# Patient Record
Sex: Male | Born: 1950 | Hispanic: No | Marital: Married | Smoking: Never smoker
Health system: Southern US, Community
[De-identification: ages and names within clinical notes are randomized; demographics above are authoritative.]

## PROBLEM LIST (undated history)

## (undated) DIAGNOSIS — N400 Enlarged prostate without lower urinary tract symptoms: Secondary | ICD-10-CM

## (undated) DIAGNOSIS — K4020 Bilateral inguinal hernia, without obstruction or gangrene, not specified as recurrent: Secondary | ICD-10-CM

## (undated) DIAGNOSIS — R972 Elevated prostate specific antigen [PSA]: Secondary | ICD-10-CM

## (undated) HISTORY — PX: APPENDECTOMY: SHX54

## (undated) HISTORY — PX: PROSTATE BIOPSY: SHX241

## (undated) HISTORY — DX: Bilateral inguinal hernia, without obstruction or gangrene, not specified as recurrent: K40.20

## (undated) HISTORY — DX: Benign prostatic hyperplasia without lower urinary tract symptoms: N40.0

## (undated) HISTORY — DX: Elevated prostate specific antigen (PSA): R97.20

---

## 2007-07-13 DIAGNOSIS — K4090 Unilateral inguinal hernia, without obstruction or gangrene, not specified as recurrent: Secondary | ICD-10-CM | POA: Insufficient documentation

## 2009-05-16 ENCOUNTER — Encounter: Payer: Self-pay | Admitting: Gastroenterology

## 2010-03-03 NOTE — Miscellaneous (Unsigned)
Continuity of Care Record  Created: todo  From: Mellody Memos  From:   From: [employer], EHR [id number]  To: Breshears, Burle  Purpose: Patient Use;       Problems  Diagnosis: Inguinal Hernia; Left (550.90)     Alerts  Allergy - No Known Drug Allergy     Medications  Non-medication order(s); Truss-bilateral.Dx-Inguiunal hernia. ; Rx     Immunizations  Td   MMR   PPD

## 2012-03-20 ENCOUNTER — Telehealth: Payer: Self-pay | Admitting: Primary Care

## 2012-03-20 ENCOUNTER — Encounter: Payer: Self-pay | Admitting: Primary Care

## 2012-03-20 NOTE — Telephone Encounter (Signed)
Left a message on 01-05-12 and 03-06-12 to call the office

## 2012-03-20 NOTE — Telephone Encounter (Signed)
No response to calls, letter sent.

## 2012-04-03 NOTE — Telephone Encounter (Signed)
I have placed two calls & sent a letter to the patient with no response regarding an overdue colonoscopy.  Please let your staff know if you wish to continue to follow up on this issue.

## 2012-04-03 NOTE — Telephone Encounter (Signed)
noted 

## 2012-12-14 ENCOUNTER — Telehealth: Payer: Self-pay | Admitting: Primary Care

## 2012-12-14 NOTE — Telephone Encounter (Signed)
Phone call to patient to remind him he is overdue for his colonoscopy.  I spoke with Gary Mcgrath's wife who informed me that her husband has a new PCP; Dr. Lennie Muckle.  Information changed in [employer].

## 2013-05-21 LAB — COLONOSCOPY

## 2014-02-22 HISTORY — PX: COLONOSCOPY: SHX174

## 2014-02-26 ENCOUNTER — Other Ambulatory Visit: Payer: Self-pay | Admitting: Urology

## 2014-11-07 ENCOUNTER — Encounter: Payer: Self-pay | Admitting: Gastroenterology

## 2014-11-19 ENCOUNTER — Ambulatory Visit: Payer: Self-pay | Admitting: Surgical Oncology

## 2014-11-19 ENCOUNTER — Encounter: Payer: Self-pay | Admitting: Surgical Oncology

## 2014-11-19 VITALS — BP 149/90 | HR 73 | Temp 97.9°F | Resp 16 | Ht 66.0 in | Wt 135.0 lb

## 2014-11-19 DIAGNOSIS — K4090 Unilateral inguinal hernia, without obstruction or gangrene, not specified as recurrent: Secondary | ICD-10-CM

## 2014-11-19 DIAGNOSIS — C61 Malignant neoplasm of prostate: Secondary | ICD-10-CM

## 2014-11-19 NOTE — Patient Instructions (Signed)
You have bilateral inguinal hernias.    We have recommended a laparoscopic bilateral hernia repair.    We will see you back after your prostate biopsy.    PATIENT INFORMATION  Lanier Ensign Emmet Messer MD. PhD. [place] AND LAPAROSCOPIC REPAIR:    Approximately 600,000 hernia repair operations are performed annually in the [place].  Many are performed by the conventional "open" method.  Some hernia repairs are performed using a small telescope known as a laparoscope.  If your surgeon has recommended a laparoscopic repair, this brochure can help you understand what a hernia is and about the treatment.     WHAT IS A HERNIA?    A hernia occurs when the inside layer of the abdominal muscle have weakened, resulting in a bulge or tear.  In the same way that an inner tube pushes through a damaged tire, thee inner lining of the abdomen pushes through the weakened area of the abdominal wall to form a small balloon like sac.  This can allow a loop of intestine or abdominal tissue to push into the sac.  The hernia can cause severe pain and other potentially serious problems that could require emergency surgery     Both men and women can get a hernia   You may be born with a hernia (congenital) or develop one over time.    A hernia does not get better over time, nor will it go away by itself.       HOW DO I KNOW IF I HAVE A HERNIA ?    The common areas where hernias occur are in the groin (inguinal), belly button  (umbilical), and the site of a previous operation (incisional).     It is usually easy to recognize a hernia.  You may notice a bulge under the skin.   You may feel pain when you lift heavy objects, cough, strain during urination or bowel movements, or during prolonged standing or sitting.      The pain may be sharp and immediate or a dull ache that gets worse toward the end of the day.     Severe, continuous pain, redness, and tenderness are signs that the  hernia may be entrapped or strangulated.  These symptoms are cause for concern and immediate contact of your physician or surgeon is required.         WHAT CAUSES A HERNIA?    The wall of the abdomen has natural areas of potential weakness.  Hernias can develop at these or other areas due to heavy strain on the abdominal wall, aging, injury, an old incision or a weakness present from birth. Anyone can develop a hernia at any age.  Most hernias in children are congenital.  In adults, a natural weakness or strain from heavy lifting, persistent coughing, difficulty with bowel movements or urinating can cause the abdominal wall to weaken or separate.     WHAT ARE THE ADVANTAGES OF LAPAROSCOPIC HERNIA REPAIR?    Laparoscopic Hernia Repair is a technique to fix tears in the abdominal wall (muscle) using small incisions, telescopes and a patch (mesh). It may offer a quicker return to work and normal activities with decreased pain for some patients.     ARE YOU A CANDIDATE FOR LAPAROSCOPIC HERNIA REPAIR?    Only after a thorough examination can your surgeon determine whether laparoscopic hernia repair is right for you.  The procedure may  not be best for some patients who have had previous abdominal surgery or underlying medical conditions.      WHAT PREPARATION IS REQUIRED?    Most hernia operations are performed on an outpatient basis, and therefore you will probably go home on the same day that the operation is performed.     Preoperative preparation includes blood work, medical evaluation, chest x-ray and an EKG depending on your age and medical condition.     After your surgeon reviews with you the potential risks and benefits of the operation, you will need to provide written consent for surgery.     It is recommended that you shower the night before or morning of the operation.    If you have difficulties moving your bowels, an enema or similar preparation may be used after consulting with your surgeon.     After  midnight the night before the operation, you should not eat or drink anything except medications that your surgeon has told you are permissible to take with a sip of water the morning of surgery.     Blood thinners such as Warfarin will need to be held 5 days prior to surgery.  Medications such as Aspirin, Ibuprofen and Naproxen should be avoided 7 days prior to surgery.     Diet medications or St. John's Wort should not be used for the two weeks prior to surgery.      Quit smoking and arrange for any help you may need at home.      HOW IS THE PROCEDURE PERFORMED?    There are few options available for a patient who has a hernia.  Most hernias require a surgical procedure.     Surgical procedures are done in one of two fashions:    1.  The open approach is done from the outside through a three to four inch incision in the groin or the area of the hernia.  The incision will extend through the skin, subcutaneous fat, and allow the surgeon to get to the level of the defect.  The surgeon may choose to use a small piece of surgical mesh to repair the defect or hole.  This technique is usually done with a local anesthetic and sedation but may be performed using a spinal or general anesthetic.       2.  The laparoscopic hernia repair.  In this approach, a laparoscope (a tiny telescope) connected to a special camera is inserted through a cannula, a small hollow tube, allowing the surgeon to view the hernia and surrounding tissue on a video screen.     Other cannulas are inserted which allow your surgeon to work "inside."  A single one inch incision is made in the umbilicus are usually necessary.  The hernia is repaired from behind the abdominal wall.  A small piece of surgical mesh is placed over the hernia defect and held in place with small surgical staples or tacks.  This operation is usually performed with general anesthesia.         WHAT HAPPENS IF THE OPERATION CANNOT BE PERFORMED OR COMPLETED BY THE LAPAROSCOPIC  METHOD?    In a small number of patients the laparoscopic method cannot be performed.  Factors that may increase the possibility of choosing or converting to the "open" procedure may include obesity, a history of prior abdominal surgery cause dense scar tissue, inability to visual organs or bleeding problems during the surgery.     The decision to perform the open  procedure is a judgement decision made by your surgeon either before or during the actual operation.  When the surgeon feels that is is safest to convert the laparoscopic procedure to an open one, this is not a complication, but rather sound surgical judgement.  The decision to convert to an open procedure is strictly based on patient safety.    WHAT SHOULD I EXPECT AFTER SURGERY?    Following the operation, you will be transferred to the recovery room where you will be monitored for 1 - 2 hours until you are fully awake.      Once you are awake, taking fluid orally without nausea, passed urine and pain is controlled, you will be sent home.     With any hernia operation, you can expect some soreness mostly during the first 24 -48 hours.     You are encouraged to be up and about the day after surgery.      With laparoscopic hernia repair, you will probably be able to get back to your normal activities within a short amount of the time. These activities include showering, driving, walking upstairs, lifting, working and engaging in sexual intercourse.     Call and schedule a follow up appointment within 2 weeks after your operation.      WHAT COMPLICATIONS CAN OCCUR ?    Any operation may be associated with complications.  The primary complications of any operation are bleeding and infection, which are uncommon with laparoscopic hernia repair.     There is a slight risk of injury to the urinary bladder, the intestines, blood vessels, nerves or the sperm tube going to the testicle.      Difficulty urinating after surgery is not unusual and may require a  temporary tube into the urinary bladder for as long as one week.     Any time a hernia is repaired it can come back.  This long-term recurrence rate less than 1 %.   Your surgeon will help you decide if the risks of laparoscopic hernia repair are less than the risks of leaving the condition untreated.     WHEN TO CALL YOUR DOCTOR    Be sure to call your physician or surgeon if you develop any of the following:     Persistent fever over 101.5 degrees F (39 C)   Bleeding   Increasing abdominal or groin swelling   Pain that is not relieved by your medications   Persistent nausea or vomiting   Inability to urinate   Chills   Persistent cough or shortness of breath   Purulent drainage (pus) from any incision   Redness surrounding any of your incisions that is worsening or getting bigger   You are unable to eat or drink liquids

## 2014-11-19 NOTE — H&P (Signed)
History and Physical  Chief Complaint   Patient presents with    New Patient Visit     History of Present Illness: Gary Mcgrath is a spirited 64 year old male who comes to the office for evaluation of an inguinal hernia. He and his wife report that he was first told he had a hernia on his left side ten years ago by his primary care physician at that time. As it was not bothersome, he did not seek any further treatment. Since then, he has noticed an increase in size and frequency of a bulge protruding from his left groin. 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 He denies any pain associated with the bulging and has not had changes in his stooling habits. He does report recently being worked up for prostate cancer via prostate MRI and biopsy, showing some evidence of low-grade prostate cancer in 03/2015 as reported by the patient and his wife. He will return a confirmatory biopsy in January of 2017. He denies any history of COPD or chronic cough. He does offer that his father and his son have both had hernias.      Patient Active Problem List   Diagnosis Code    Inguinal Hernia K40.90     No past medical history on file.  No past surgical history on file.   Allergies   Allergen Reactions    Epinephrine Other (See Comments)     Fainting     No Known Drug Allergy      Created by Conversion - 0;       reports that he has never smoked. He does not have any smokeless tobacco history on file.  No outpatient encounter prescriptions on file as of 11/19/2014.     No facility-administered encounter medications on file as of 11/19/2014.      No family history on file.  He reports that he has never smoked. He does not have any smokeless tobacco history on file.  Review of Systems   Constitutional: Negative for fever and weight loss.   HENT: Negative for sore throat.    Eyes: Negative for photophobia.   Respiratory:  Negative for cough and shortness of breath.    Cardiovascular: Negative for chest pain, palpitations and leg swelling.   Gastrointestinal: Positive for abdominal pain. Negative for constipation, diarrhea, heartburn, nausea and vomiting.   Genitourinary: Negative for dysuria.   Musculoskeletal: Negative for back pain.   Skin: Negative for itching and rash.   Neurological: Negative for headaches.   Endo/Heme/Allergies: Does not bruise/bleed easily.     Visit Vitals    BP 149/90 (BP Location: Left arm, Patient Position: Sitting, Cuff Size: adult)    Pulse 73    Temp 36.6 C (97.9 F) (Temporal)    Resp 16    Ht 167.6 cm (5\' 6" )    Wt 61.2 kg (135 lb)    SpO2 98%    BMI 21.79 kg/m2     Physical Exam   Constitutional: He is oriented to person, place, and time. He appears well-developed and well-nourished. No distress.   HENT:   Head: Normocephalic and atraumatic.   Mouth/Throat: No oropharyngeal exudate.   Eyes: Conjunctivae and EOM are normal. Pupils are equal, round, and reactive to light. No scleral icterus.   Neck: Normal range of motion. Neck supple.   Cardiovascular: Normal rate, normal heart  sounds and intact distal pulses.    No murmur heard.  Pulmonary/Chest: Effort normal and breath sounds normal. No respiratory distress. He has no wheezes.   Abdominal: Soft. Bowel sounds are normal. He exhibits no distension and no mass. There is no tenderness. There is no rebound and no guarding. A hernia is present. Hernia confirmed positive in the right inguinal area and confirmed positive in the left inguinal area.   Genitourinary: Penis normal.       Right testis shows no mass. Left testis shows no mass.   Musculoskeletal: Normal range of motion. He exhibits no edema or tenderness.   Lymphadenopathy:     He has no cervical adenopathy.        Right: No inguinal adenopathy present.        Left: No inguinal adenopathy present.   Neurological: He is alert and oriented to person, place, and time. He exhibits normal  muscle tone.   Skin: Skin is warm and dry. No rash noted. No erythema. No pallor.   Psychiatric: He has a normal mood and affect. His behavior is normal. Judgment and thought content normal.     Imaging:   MRI of prostate: extra-prostatic imaging noted bilateral, fat-containing inguinal hernias      Assessment:   This is a vigorous 64 year old man who reports a one year history of low risk prostate cancer undergoing watchful waiting who presents with prior imaging and physical exam consistent with bilateral direct inguinal hernias. He is currently completing workup for his prostate, with plans to undergo a further biopsy and imaging this coming January and would like to plan further management of these hernias following further characterization of his disease.    We generally recommend laparoscopic properitoneal repair for inguinal hernias.  Per the patient's's report, his prostate cancer was felt to be very indolent and unlikely to need therapy in the near future.  While it has been proven that robotic prostatectomy can be performed in patients who have previously undergone extraperitoneal laparoscopic inguinal hernia repair with mesh, it does complicate the procedure and therefore multidisciplinary treatment planning is prudent.    It seems that the patient was here for an informational visit and that while his hernia has enlarged over the last several years it remains minimally asymptomatic and does not interfere with his work or play.    Plan:    The patient will follow-up in January   He was instructed to call the office should symptoms arise in the interim   We anticipate performing a laparoscopic bilateral inguinal hernia repair on an ambulatory basis   Instructional information was given    Dictated with Dragon and every reasonable attempt has been made to ensure accuracy.

## 2015-02-23 HISTORY — PX: HERNIA REPAIR: SHX51

## 2015-03-18 ENCOUNTER — Encounter: Payer: Self-pay | Admitting: Surgical Oncology

## 2015-03-18 ENCOUNTER — Ambulatory Visit: Payer: Self-pay | Admitting: Surgical Oncology

## 2015-03-18 DIAGNOSIS — K4090 Unilateral inguinal hernia, without obstruction or gangrene, not specified as recurrent: Secondary | ICD-10-CM

## 2015-03-18 NOTE — Progress Notes (Signed)
Chief Compliant    Gary Mcgrath is a 65 y.o. male who presents for follow up care on 03/18/2015 for follow-up.     Diagnosis   Bilateral inguinal hernia, prior history of low volume prostate cancer   History of the Present Illness   The patient presents today wishing to proceed with hernia repair.  He was seen earlier regarding a symptomatic left angle hernia and small occult right inguinal hernia.  He had a prostate biopsy that suggests the possibility of early stage prostate cancer.  Repeat biopsies performed in November 2016 and per the patient was negative.  Watchful waiting is being planned for his prostate.    He remains symptomatic from his left inguinal hernia.  There is localized pain.  It has not changed in size.  He has no gastrointestinal symptoms.  He has adequate urine flow.    He is planning follow-up with his urologist Dr. Legrand Como in 12 months.  The patient is also planning retirement in April 2017.  He wishes to proceed with hernia repair.    Past Medical History   Diagnosis Date    Bilateral inguinal hernia     Enlarged prostate      Past Surgical History   Procedure Laterality Date    Appendectomy       age 48    Colonoscopy  2016    Prostate biopsy  03/2014 & 12/2014     Interval History    See above  Social History     Social History     Social History    Marital status: Married     Spouse name: N/A    Number of children: N/A    Years of education: N/A     Occupational History    Not on file.     Social History Main Topics    Smoking status: Never Smoker    Smokeless tobacco: Never Used    Alcohol use Not on file      Comment: social drinker    Drug use: No    Sexual activity: Not on file     Social History Narrative    He is employed as a Personal assistant.  He is married and lives with his wife.  They have 1 son. His is active,        Medications     Prior to Admission medications    Not on File      Allergies     Allergies   Allergen Reactions    Epinephrine Other (See Comments)        Fainting      Review of Systems   Review of Systems   Constitutional: Negative for fever and weight loss.   HENT: Negative for sore throat.    Eyes: Negative for photophobia.   Respiratory: Negative for cough and shortness of breath.    Cardiovascular: Negative for chest pain, palpitations and leg swelling.   Gastrointestinal: Positive for abdominal pain. Negative for constipation, diarrhea, heartburn, nausea and vomiting.   Genitourinary: Negative for dysuria.   Musculoskeletal: Negative for back pain.   Skin: Negative for itching and rash.   Neurological: Negative for headaches.   Endo/Heme/Allergies: Does not bruise/bleed easily.     Physical Exam     Visit Vitals    BP 137/81 (BP Location: Left arm, Patient Position: Sitting, Cuff Size: adult)    Pulse 97    Temp 36.8 C (98.2 F) (Temporal)    Resp 16  Ht 167.6 cm (5\' 6" )    Wt 62.1 kg (137 lb)    SpO2 97%    BMI 22.11 kg/m2     Physical Exam   Constitutional: He is oriented to person, place, and time. He appears well-developed and well-nourished. No distress.   HENT:   Head: Normocephalic and atraumatic.   Mouth/Throat: No oropharyngeal exudate.   Eyes: Conjunctivae and EOM are normal. Pupils are equal, round, and reactive to light. No scleral icterus.   Neck: Normal range of motion. Neck supple.   Cardiovascular: Normal rate, normal heart sounds and intact distal pulses.    No murmur heard.  Pulmonary/Chest: Effort normal and breath sounds normal. No respiratory distress. He has no wheezes.   Abdominal: Soft. Bowel sounds are normal. He exhibits no distension and no mass. There is no tenderness. There is no rebound and no guarding. A hernia is present. Hernia confirmed positive in the right inguinal area and confirmed positive in the left inguinal area.       Musculoskeletal: Normal range of motion. He exhibits no edema or tenderness.   Lymphadenopathy:     He has no cervical adenopathy.   Neurological: He is alert and oriented to person, place,  and time. He exhibits normal muscle tone.   Skin: Skin is warm and dry. No rash noted. No erythema. No pallor.   Psychiatric: He has a normal mood and affect. His behavior is normal. Judgment and thought content normal.     Laboratory Data     Imaging Data     Assessment    Vigorous 65 year old with low risk prostate history.  He is a good candidate for laparoscopic inguinal hernia repair.  Should he require surgical intervention on his prostate in the future, it has been established that it can be safely done using a robotic approach despite prior laparoscopic inguinal hernia repair.    It is important to the patient to have the hernia repair scheduled after 08 May 2015.  Plan    Laparoscopic bilateral inguinal hernia repair on an ambulatory basis at [facility] after March 16.  There will be a preoperative visit.    Author: Charolotte Eke, MD  as of: 03/18/2015  at: 11:45 AM

## 2015-05-07 ENCOUNTER — Ambulatory Visit: Payer: Self-pay | Admitting: Surgery

## 2015-05-13 NOTE — Invasive Procedure Plan of Care (Cosign Needed)
Invasive Procedure Plan of Care (Consent Form 419):   Condition(s) Addressed: Bilateral inguinal hernia   Person Performing Procedure: SCHOENIGER, LUKE   Side: Bilateral    Procedure: Laparoscopic bilateral inguinal hernia repair   Special Equipment: none   Planned Anesthesia: General   Benefits: Relief of symptoms   Risks: Bleeding, infection, blood clots, risks of anesthesia: heart/lung problems, injury to adjacent structures, hematoma/seroma, recurrence, urinary retention   Alternatives: Traditional approach, supportive care (belt, truss)   Expected Length of Stay:  day(s)   Pt Decision: Agrees to proceed     ----------------------------------------------------------------------------------------------------------------------------------------  Consent:  I hereby give my consent and authorize SCHOENIGER, LUKE  (The list of possible assistants, all of whom are privileged to provide surgical services at the hospital, is available)  To treat the following: Bilateral inguinal hernia  Procedure includes: Laparoscopic bilateral inguinal hernia repair  1 The care provider has explained my condition to me, the benefits of having the above treatment procedure, and alternate ways of treating my condition. I understand that no guarantees have been made to me about the result of the treatment. The alternatives to this procedure include: Traditional approach, supportive care (belt, truss)   2 The care provider has discussed with me the reasonably foreseeable risks of the treatment and that there may be undesirable results. The risks that are specifically related to this procedure include: Bleeding, infection, blood clots, risks of anesthesia: heart/lung problems, injury to adjacent structures, hematoma/seroma, recurrence, urinary retention   3 I understand that during the treatment a condition may be discovered which was not known before the treatment started. Therefore, I authorize the care provider to perform any additional  or different treatment which is thought necessary and available.   4 Any tissue, parts, or substances removed during the procedure may be retained or disposed of in accordance with customary scientific, educational and clinical practice.   5 Vendor information if appropriate: If a vendor representative is expected to be present during my procedure, it has been explained to me that the vendor representative works for (manufacturer of the device to be used) and that his/her role includes . I consent to the vendor representative's presence and involvement as described. If circumstances change and a decision is made during my procedure that a vendor representative's presence is needed, I will be notified of the above after my procedure is completed.  Equipment: none     6 Patient Consent for Medical or Surgical Procedure: I have carefully read and fully understand this informed consent form, and have had sufficient opportunity to discuss my condition and the above procedure(s) with the care provider and his/her associates, and all of my questions have been answered to my satisfaction.    Agrees to proceed       ____________________________________________________   _______________   Signature of Patient  Date/Time     ____________________________________________________   _______________   Signature of Parent or Legal Guardian  (if Patent is unable to sign or is a minor) Date/Time       Complete this section for all OR procedures and all other invasive internal procedures performed in any setting.  7 Consent for Receipt of Tissue(s): Not applicable   8 For those procedures that have the potential for significant blood loss: transfusion is not expected to be needed but may be required and given in an emergency, I consent to the transfusion of blood or blood components that may be necessary before, during or after the procedure. I have been  informed that no transfusion is 100% safe, however present testing methods make  the risks of infection very small. Risks include infection from viruses, bacteria, or parasites, including but not limited to HIV (the AIDS virus) and hepatitis, as well as fever, chills, allergy, volume overload, or death. I have discussed possible alternatives with my care provider, including no transfusion, autologous transfusion (donation of my own blood), designated/directed donor transfusion (collection of blood from donors selected by me) or blood salvage during the procedure. I understand that these alternatives may not be available due to timing or health reasons, and the above risks may still apply.   9 Patient Consent for Blood/Tissue: I have had a chance to discuss the risks, benefits and alternatives regarding transfusion/receipt of tissue (as above) with my healthcare provider. My decision(s) regarding the transfusion of blood or blood components and/or the receipt of tissue are as above. I understand this covers my perioperative/periprocedural (before, during, and after the surgery/procedure) course of treatment.    Patient sign here unless 7 and 8 do not apply.       ____________________________________________________   _______________   Signature of Patient Date/Time     ____________________________________________________   _______________   Signature of Parent or Legal Guardian  (if Patent is unable to sign or is a minor) Date/Time      I have discussed the planned procedure, including the potential for any transfusion of blood products or receipt of tissue as necessary, expected benefits, the potential complications and risks and possible alternatives and their benefits and risks with the patient or the patient's surrogate. In my opinion, the patent or the patient's surrogate understands the proposed procedure, its risks, benefits, and alternatives.    Electronically signed by Arty Baumgartner, NP at 11:29 AM

## 2015-05-14 ENCOUNTER — Encounter: Payer: Self-pay | Admitting: Surgery

## 2015-05-14 ENCOUNTER — Ambulatory Visit: Payer: Self-pay | Admitting: Surgery

## 2015-05-14 VITALS — BP 128/67 | HR 86 | Temp 97.2°F | Resp 16 | Ht 66.0 in | Wt 135.0 lb

## 2015-05-14 DIAGNOSIS — K4020 Bilateral inguinal hernia, without obstruction or gangrene, not specified as recurrent: Secondary | ICD-10-CM

## 2015-05-14 NOTE — H&P (Signed)
Dear Dr. Moreen Fowler,    We had the pleasure of seeing Gary Mcgrath in surgery clinic for continuing follow up and definitive surgical planning.     Lakhi Dils is a 65 y.o. male with a past medical history significant for BPH, elevated PSA (prostate biopsy neg for malignancy: 03/2015) and bilateral inguinal hernia.     The patient was initially seen in September 2016 with complaint of enlargement and pain of a known left inguinal hernia was diagnosed by his PCP 10 years prior. Physical exam findings confirmed left inguinal hernia and a small right inguinal hernia.  Laparoscopic repair was recommended.  At that time he was undergoing evaluation for possible prostate cancer. He demonstrated minimal symptoms and it was deemed safe for him to postpone surgery until prostate cancer evaluation completed.     He was last seen in our office in January 2017 with request to proceed with surgery. Recent prostate biopsy was negative     He presents today with his wife. They have concerns in regards to his upcoming surgery.  From a clinical standpoint he reports noticing some enlargement of the bulge and increasing discomfort with prolonged standing. He denies GI obstructive symptoms.  He does have BPH with nocturia x 2.  He denies chronic cough or constipation    Review of Systems:  A comprehensive review of systems was negative except as above    Sayd  has a past medical history of Bilateral inguinal hernia and Enlarged prostate.    He  has a past surgical history that includes Appendectomy; Colonoscopy (2016); and ostate biopsy (03/2014 & 12/2014, 03/2015).    He currently has no medications in their medication list.    He is allergic to epinephrine.    He  reports that he has never smoked. He has never used smokeless tobacco.    Xue  reports that he drinks alcohol.     Family history includes Alzheimer's disease in his father; Breast cancer in his mother; Hypertension in his brother and father.    Speaks  [place].  Understands/speaks English. He is employed as a Personal assistant.  He is married and lives with his wife.  They have 1 son.     Physical Exam:  Abdulahi's  height is 167.6 cm (5\' 6" ) and weight is 61.2 kg (135 lb). His temporal temperature is 36.2 C (97.2 F). His blood pressure is 128/67 and his pulse is 86. His respiration is 16 and oxygen saturation is 98%.      General Appearance:    Alert, cooperative, no distress, appears stated age   Head:    Normocephalic, without obvious abnormality, atraumatic   Eyes:    PERRL, conjunctiva/corneas clear, EOM's intact, benign, both eyes        Throat:   Lips, mucosa, and tongue normal; teeth and gums normal   Neck:   Supple, symmetrical, trachea midline, no adenopathy;        thyroid:  No enlargement/tenderness/nodules; no carotid    bruit or JVD   Lungs:     Clear to auscultation bilaterally, respirations unlabored   Heart:    Regular rate and rhythm, S1 and S2 normal, no murmur, rub   or gallop   Abdomen:     Soft, non-tender, bowel sounds active all four quadrants,     no masses, no organomegaly. Healed RLQ incision.  Obvious left inguinal hernia. Small right inguinal hernia on Valsalva.       Extremities:   Extremities normal, atraumatic, no  cyanosis or edema      Assessment:      65 year old male with bilateral inguinal hernia (left > right).  Surgical repair is planned for 05/19/2015.  The patient and wife have additional questions regarding risks of surgery and possible alternatives. Dr. Dennard Schaumann to obtain consent day of surgery      Plan:      1. Discussed the risk of surgery including bleeding, infection, blood clots, injury to adjacent structures, recurrence, seroma/hematoma, urinary retention, and the risks of general anesthetic including MI, CVA, sudden death or even reaction to anesthetic medications. The patient understands the risks, any and all questions were answered to the patient's satisfaction.  2.Preoperative teaching completed, patient verbalizes  understanding of instructions, he does not object to blood transfusion, preoperative orders completed. Patient to discontinue ASA/NSAIDS 7 days prior to surgery.     Although patient speaks/understands English there was a mild language barrier, his wife served as interpreter during the history-taking and subsequent discussion with this patient.    He is anxious to proceed and will be seen again in follow up two weeks after surgery.    Best wishes,  Arty Baumgartner, NP  05/14/2015

## 2015-05-14 NOTE — Patient Instructions (Addendum)
[facility]     NAME: Gary Mcgrath    SURGERY DATE: Monday, March 27     1. On the day before surgery CALL [phone] between 2:30 PM and 7 PM to find out: a): the time to arrive at the [facility] and b) the time of your procedure.    NOTE: Patients scheduled for a procedure on Monday should call the Friday before.    Please note surgery start time is approximate. You may want to bring something to help pass the time. PLEASE ARRIVE ON TIME.         2. DIRECTIONS TO [facility]: On the day of your procedure  park in the parking garage and take the elevator/stairs to Level One (1), then follow the walkway to the Main Lobby.  Walk past the Information Desk in the lobby, towards the Lab & Outpatient Services.  Follow the RED (R) ceiling tags to the RED elevators. (Valet parking is available outside the front entrance of the hospital between 6:00 AM and 5:00 PM.)    [place] (B-Level): take the RED elevators to the Basement (Level B - Two floors down) to the [place] and check in with the receptionist at the desk.       3. Before coming to the hospital, remove all makeup, (including mascara), jewelry (including wedding band and watch), hair accessories and nail polish from toes and fingers. Do not bring any valuables (money, wallet, purse, jewelry, or contact lenses).   Bring Photo ID        4. Call your surgeon if you become ill before the procedure day, i.e. fever, chills, nausea, vomiting, sore throat.       5. EATING GUIDELINES: Follow the instructions below unless directed by your surgeon. Starting:        Nothing to eat after midnight on the day of your surgery. No candy,gum, mints. You can have clear liquids up to 4 hours before your surgery. This includes water, apple juice, clear carbonated beverages, black coffee, clear tea. No milk, cream, or non dairy creamers.   Failure to follow these instructions, could lead to a  delay or cancellation of your procedure.       6. MEDICATIONS:    On the morning of surgery, take only the medications listed below at the usual time or before leaving for the  hospital:     No medications    Medications should only be taken with no more than one ounce of water.  You may take Tylenol (Acetaminophen) if needed.    Aspirin: Do not take any aspirin products for 7 days before the procedure date.  Anti-Inflammatory products: Do not take any non-steroidal anti-inflammatory agents such as Ibuprofen (Advil, Motrin) or Naproxen (Aleve) 7 days before the procedure.       7.  EXPECTED STAY:    ([place]) Same Day Surgery: You are going home on the same day as your surgery. Most people are ready for discharge one half hour to two hours after returning to the [facility] from the [facility] (PACU). You will be given discharge instructions before you go home.    Health standards require that a responsible adult must accompany any patient who has received anesthetics or sedation and is going home the same day.   ? You must arrange a ride home before coming to surgery.         8. Your family will be  directed to a waiting area when you are taken to surgery.  ? We ask that only one or two family members accompany you on the day of your procedure.         ? No children under the age of 81 are allowed as visitors in [place]  ? Your family will be notified when your surgery is completed and you have arrived on the patient care unit.        Proper Handwashing is simple - here's all you have to do:    When using soap & water:  The following steps only take 15 to 20 seconds. To time yourself sing the Happy Birthday song twice.   1. Wet your hands and forearms. Put some soap in your palm and rub your palms together.    2. Next, rub your right palm over the left hand, then your left palm over the right hand.    3. Rub your hands together palm to palm, with your fingers interlaced.    4. Rub the  backs of your fingers up against the opposing fingers. Make sure to clean under and around your fingernails.    5. Hold your right thumb with your left hand and rotate. Do the same with the opposite hand.    6. Rub your right palm with fingertips of your left hand, then your left palm with the fingertips of your right hand.    When using hand gel:   1. Use enough hand gel or foam to completely cover your hands.      2. Rub your hands together, palm to palm.    3. Rub the back of each hand with the palm of the other hand.      4. Spread the sanitizer  over and under  your fingernails.        5. Spread the gel  between your fingers.    6. keep rubbing your  hands together  until they are dry.      7. Do not rinse your hands or dry them with a towel.        Common Germs:   Influenza (Flu):  Easily passed from person to person through coughing or sneezing (if the nose and mouth are not covered).   Staph Aureus/MRSA:  Can cause a wide range of Illnesses from minor skin infections to life threatening conditions such as pneumonia, blood stream infections and toxic shock syndrome. Most often passed on by direct contact with someone who has the germ.   Salmonella:  Found in feces of infected person or animal. Passed  through uncooked food, un-washed vegetables and contaminated utensils.   E. Coli:  Found on animals, animal parts and obejects that have been in contact with animals. Often passed through contaminated food.       Surgical Site Infections FAQs    What is a Surgical Site infection (SSI)?  A surgical site infection is an infection that occurs after surgery In the part of the body where the surgery took place. Most patients who have surgery do not develop an infection. However, Infections develop in about 1 to 3 out of every 100 patients who have surgery.     Some of the common symptoms of a surgical site infection are:    Redness and pain around the area where you had surgery    Drainage of cloudy fluid from your  surgical wound    Fever     Can SSIs  be treated?   Yes. Most surgical site infections can be treated with antibiotics. The antibiotic given to you depends on the bacteria (germs) causing the Infection. Sometimes patients with SSIs also need another surgery to treat the infection.     What are some of the things that hospitals are doing to prevent SSls?   To prevent SSIs, doctors, nurses, and other healthcare providers:    Clean their hands and arms up to their elbows with an antiseptic agent just before the surgery.    Clean their hands with soap and water or an alcohol-based hand rub before and after caring for each patient.    May remove some of your hair Immediately before your surgery using electric clippers If the hair Is in the same area where the procedure will occur. They should not shave you with a razor.    Wear special hair covers, masks, gowns, and gloves during surgery to keep the surgery area clean.    Give you antibiotics before your surgery starts. in most cases, you should get antibiotics within 60 mInutes before the surgery starts and the antibiotics should be stopped within 24 hours after surgery.    Clean the skin at the site of your surgery with a special soap that kills germs.     What can I do to help prevent SSIs?   Before your surgery:    Tell your doctor about other medical problems you may have. Health problems such as allergies, diabetes, and obesity could affect your surgery and your treatment.    Quit smoking. Patients who smoke get more Infections. Talk to your doctor about how you can quit before your surgery.    Do not shave near where you will have surgery. Shaving with a razor can Irritate your skin and make it easier to develop an infection.   At the time of your surgery:    Speak up if someone tries to shave you with a razor before surgery. Ask why you need to be shaved and talk with your surgeon if you have any concerns.    Ask if you will get antibiotics before  surgery.   After your surgery:    Make sure that your healthcare providers clean their hands before examining you, either with soap and water or an alcohol-based hand rub,      If you do not see your healthcare providers wash their hands,   please ask them to do so.     Family and friends who visit you should not touch the surgical wound or dressings.    Family and friends should clean their hands with soap and water or an alcohol-based hand rub before and after visiting you. If you do not see them clean their hands, ask them to clean their hands.   What do I need to do when I go home from the hospital?    Before you go home, your doctor or nurse should explain everyt hing you need to know about taking care of your wound. Make sure you understand how to care for your wound before you leave the hospital.    Always clean your hands before and after caring for your wound.    Before you go home, make sure you know who to contact If you have questions or problems after you get home.    If you have any symptoms of an Infection, such as redness and pain at the surgery site, drainage, or fever, call your doctor immediately.  if you have additional questions, Please ask your doctor or nurse.     Postoperative Pearls:  1. Expect swelling and bruising around 2-3days after surgery  2. To prevent constipation: Increase fluids and fiber intake                                              Walk                                              Milk of Magnesia: use as directed on bottle.  Repeat if no results in 4 hours.                                               Try not to go 2  days without a bowel movement                                               Use colace or Miralax daily while taking narcotics

## 2015-05-15 ENCOUNTER — Encounter: Payer: Self-pay | Admitting: Surgery

## 2015-05-18 NOTE — Anesthesia Preprocedure Evaluation (Addendum)
Anesthesia Pre-operative History and Physical for Gary Mcgrath    ______________________________________________________________________________________  CPM Assessment Not Completed  <[email or web address]>  Anesthesia Evaluation Information Source: patient, records     ANESTHESIA  Pertinent(-):  history of anesthetic complications, Family Hx of Anesthetic Complications    GENERAL  Pertinent (-):  history of anesthetic complications, Family Hx of Anesthetic Complications     PULMONARY  Pertinent(-): smoking, asthma, recent URI, snoring, sleep apnea    CARDIOVASCULAR  Good(4+METs) Exercise Tolerance  Pertinent(-):  hypertension, past MI, angina    GI/HEPATIC/RENAL  Last PO Intake: Enter Last PO Intake in ROS/Med Hx Tab    + Urinary Issues           BPH  Pertinent(-):  GERD, liver  issues, renal issues NEURO/PSYCH  Pertinent(-):  seizures, cerebrovascular event    ENDO/OTHER  Pertinent(-):  diabetes mellitus, thyroid disease    HEMALOGIC  Pertinent(-):  bruises/bleeds easily       Physical Exam    Airway            Mouth opening: normal            Mallampati: II            TM distance (fb): >3 FB            Neck ROM: full  Dental    Comment: Poor dentition   Cardiovascular           Rhythm: regular           Rate: normal  No murmur       Pulmonary     breath sounds clear to auscultation    Mental Status     oriented to person, place and time       ________________________________________________________________________  Plan  ASA Score  2  Anesthetic Plan general    Induction (routine IV); General Anesthesia/Sedation Maintenance Plan (inhaled agents);  Airway Manipulation (direct laryngoscopy); Airway (cuffed ETT); Line ( use current access); Monitoring (standard ASA); Positioning (supine); PONV Plan (dexamethasone and ondansetron); Pain (per surgical team and intraop local); PostOp (PACU)    Informed Consent     Risks:          Risks discussed were commensurate with the plan listed above with the following specific  points: N/V, aspiration and sore throat , damage to:(eyes, nerves, teeth), awareness, unexpected serious injury, allergic Rx    Anesthetic Consent:      Anesthetic plan (and risks as noted above) were discussed with patient    Plan also discussed with team members including:  CRNA    Attending Attestation:  As the primary attending anesthesiologist, I attest that the patient or proxy understands and accepts the risks and benefits of the anesthesia plan. I also attest that I have personally performed a pre-anesthetic examination and evaluation, and prescribed the anesthetic plan for this particular location within 48 hours prior to the anesthetic as documented. Idalia Needle, MD 9:30 AM

## 2015-05-19 ENCOUNTER — Encounter: Payer: Self-pay | Admitting: Anesthesiology

## 2015-05-19 ENCOUNTER — Encounter
Admission: RE | Disposition: A | Discharge status: Home / Self Care | Payer: Self-pay | Source: Ambulatory Visit | Attending: Surgical Oncology

## 2015-05-19 ENCOUNTER — Ambulatory Visit
Admission: RE | Admit: 2015-05-19 | Discharge: 2015-05-19 | Disposition: A | Discharge status: Home / Self Care | Payer: Self-pay | Source: Ambulatory Visit | Attending: Surgical Oncology | Admitting: Surgical Oncology

## 2015-05-19 DIAGNOSIS — K4020 Bilateral inguinal hernia, without obstruction or gangrene, not specified as recurrent: Secondary | ICD-10-CM

## 2015-05-19 HISTORY — PX: PR LAP,INGUINAL HERNIA REPR,INITIAL: 49650

## 2015-05-19 HISTORY — PX: PR LAPAROSCOPY SURG RPR INITIAL INGUINAL HERNIA: 49650

## 2015-05-19 SURGERY — REPAIR, HERNIA, INGUINAL, LAPAROSCOPIC
Anesthesia: General | Site: Groin | Laterality: Bilateral | Wound class: Clean

## 2015-05-19 MED ORDER — FENTANYL CITRATE 50 MCG/ML IJ SOLN *WRAPPED*
INTRAMUSCULAR | Status: DC | PRN
Start: 2015-05-19 — End: 2015-05-19
  Administered 2015-05-19: 100 ug via INTRAVENOUS
  Administered 2015-05-19: 50 ug via INTRAVENOUS

## 2015-05-19 MED ORDER — HYDROCODONE-ACETAMINOPHEN 5-325 MG PO TABS *I*
1.0000 | ORAL_TABLET | Freq: Four times a day (QID) | ORAL | Status: DC | PRN
Start: 2015-05-19 — End: 2015-05-20

## 2015-05-19 MED ORDER — MIDAZOLAM HCL 1 MG/ML IJ SOLN *I* WRAPPED
INTRAMUSCULAR | Status: DC | PRN
Start: 2015-05-19 — End: 2015-05-19
  Administered 2015-05-19: 2 mg via INTRAVENOUS

## 2015-05-19 MED ORDER — FENTANYL CITRATE 50 MCG/ML IJ SOLN *WRAPPED*
INTRAMUSCULAR | Status: AC
Start: 2015-05-19 — End: 2015-05-19
  Filled 2015-05-19: qty 5

## 2015-05-19 MED ORDER — HEPARIN SODIUM 5000 UNIT/ML SQ *I*
5000.0000 [IU] | Freq: Once | SUBCUTANEOUS | Status: AC
Start: 2015-05-19 — End: 2015-05-19
  Administered 2015-05-19: 5000 [IU] via SUBCUTANEOUS

## 2015-05-19 MED ORDER — DEXAMETHASONE SODIUM PHOSPHATE 4 MG/ML INJ SOLN *WRAPPED*
INTRAMUSCULAR | Status: AC
Start: 2015-05-19 — End: 2015-05-19
  Filled 2015-05-19: qty 2

## 2015-05-19 MED ORDER — SODIUM CHLORIDE 0.9 % IV SOLN WRAPPED *I*
20.0000 mL/h | Status: DC
Start: 2015-05-19 — End: 2015-05-19

## 2015-05-19 MED ORDER — LIDOCAINE HCL 2 % IJ SOLN *I*
INTRAMUSCULAR | Status: DC | PRN
Start: 2015-05-19 — End: 2015-05-19
  Administered 2015-05-19: 40 mg via INTRAVENOUS

## 2015-05-19 MED ORDER — ROCURONIUM BROMIDE 10 MG/ML IV SOLN *WRAPPED*
Status: AC
Start: 2015-05-19 — End: 2015-05-19
  Filled 2015-05-19: qty 5

## 2015-05-19 MED ORDER — KETOROLAC TROMETHAMINE 30 MG/ML IJ SOLN *I*
INTRAMUSCULAR | Status: AC
Start: 2015-05-19 — End: 2015-05-19
  Filled 2015-05-19: qty 1

## 2015-05-19 MED ORDER — DEXAMETHASONE SODIUM PHOSPHATE 4 MG/ML INJ SOLN *WRAPPED*
INTRAMUSCULAR | Status: DC | PRN
Start: 2015-05-19 — End: 2015-05-19
  Administered 2015-05-19: 8 mg via INTRAVENOUS

## 2015-05-19 MED ORDER — BUPIVACAINE HCL 0.25 % IJ SOLUTION *WRAPPED*
Status: DC | PRN
Start: 2015-05-19 — End: 2015-05-19
  Administered 2015-05-19: 30 mL via SUBCUTANEOUS

## 2015-05-19 MED ORDER — HALOPERIDOL LACTATE 5 MG/ML IJ SOLN *I*
0.5000 mg | Freq: Once | INTRAMUSCULAR | Status: DC | PRN
Start: 2015-05-19 — End: 2015-05-19

## 2015-05-19 MED ORDER — HYDROCODONE-ACETAMINOPHEN 5-325 MG PO TABS *I*
2.0000 | ORAL_TABLET | Freq: Four times a day (QID) | ORAL | Status: DC | PRN
Start: 2015-05-19 — End: 2015-05-20

## 2015-05-19 MED ORDER — ONDANSETRON HCL 2 MG/ML IV SOLN *I*
1.0000 mg | Freq: Once | INTRAMUSCULAR | Status: DC | PRN
Start: 2015-05-19 — End: 2015-05-19

## 2015-05-19 MED ORDER — LIDOCAINE HCL 1 % IJ SOLN *I*
0.1000 mL | INTRAMUSCULAR | Status: DC | PRN
Start: 2015-05-19 — End: 2015-05-19

## 2015-05-19 MED ORDER — LACTATED RINGERS IV SOLN *I*
20.0000 mL/h | INTRAVENOUS | Status: DC
Start: 2015-05-19 — End: 2015-05-19

## 2015-05-19 MED ORDER — SUGAMMADEX SODIUM 100 MG/1ML IV SOLN *WRAPPED*
INTRAVENOUS | Status: DC | PRN
Start: 2015-05-19 — End: 2015-05-19
  Administered 2015-05-19: 120 mg via INTRAVENOUS

## 2015-05-19 MED ORDER — MIDAZOLAM HCL 1 MG/ML IJ SOLN *I* WRAPPED
INTRAMUSCULAR | Status: AC
Start: 2015-05-19 — End: 2015-05-19
  Filled 2015-05-19: qty 2

## 2015-05-19 MED ORDER — PROPOFOL 10 MG/ML IV EMUL (INTERMITTENT DOSING) WRAPPED *I*
INTRAVENOUS | Status: DC | PRN
Start: 2015-05-19 — End: 2015-05-19
  Administered 2015-05-19: 200 mg via INTRAVENOUS

## 2015-05-19 MED ORDER — PROMETHAZINE HCL 25 MG/ML IJ SOLN *I*
6.2500 mg | Freq: Once | INTRAMUSCULAR | Status: AC | PRN
Start: 2015-05-19 — End: 2015-05-19

## 2015-05-19 MED ORDER — CEFAZOLIN 2000 MG IN STERILE WATER 20ML SYRINGE *I*
2000.0000 mg | PREFILLED_SYRINGE | INTRAVENOUS | Status: AC
Start: 2015-05-19 — End: 2015-05-19
  Administered 2015-05-19: 2000 mg via INTRAVENOUS

## 2015-05-19 MED ORDER — HYDROMORPHONE HCL 2 MG/ML IJ SOLN *WRAPPED*
0.4000 mg | INTRAMUSCULAR | Status: DC | PRN
Start: 2015-05-19 — End: 2015-05-19

## 2015-05-19 MED ORDER — LIDOCAINE HCL 2 % (PF) IJ SOLN *I*
INTRAMUSCULAR | Status: AC
Start: 2015-05-19 — End: 2015-05-19
  Filled 2015-05-19: qty 5

## 2015-05-19 MED ORDER — ONDANSETRON HCL 2 MG/ML IV SOLN *I*
INTRAMUSCULAR | Status: AC
Start: 2015-05-19 — End: 2015-05-19
  Filled 2015-05-19: qty 2

## 2015-05-19 MED ORDER — ONDANSETRON HCL 2 MG/ML IV SOLN *I*
4.0000 mg | Freq: Four times a day (QID) | INTRAMUSCULAR | Status: DC | PRN
Start: 2015-05-19 — End: 2015-05-20

## 2015-05-19 MED ORDER — LACTATED RINGERS IV SOLN *I*
125.0000 mL/h | INTRAVENOUS | Status: DC
Start: 2015-05-19 — End: 2015-05-19
  Administered 2015-05-19: 125 mL/h via INTRAVENOUS

## 2015-05-19 MED ORDER — HEPARIN SODIUM 5000 UNIT/ML SQ *I*
SUBCUTANEOUS | Status: AC
Start: 2015-05-19 — End: 2015-05-19
  Filled 2015-05-19: qty 1

## 2015-05-19 MED ORDER — PROPOFOL 10 MG/ML IV EMUL (INTERMITTENT DOSING) WRAPPED *I*
INTRAVENOUS | Status: AC
Start: 2015-05-19 — End: 2015-05-19
  Filled 2015-05-19: qty 20

## 2015-05-19 MED ORDER — ONDANSETRON HCL 2 MG/ML IV SOLN *I*
INTRAMUSCULAR | Status: DC | PRN
Start: 2015-05-19 — End: 2015-05-19
  Administered 2015-05-19: 4 mg via INTRAMUSCULAR

## 2015-05-19 MED ORDER — KETOROLAC TROMETHAMINE 30 MG/ML IJ SOLN *I*
INTRAMUSCULAR | Status: DC | PRN
Start: 2015-05-19 — End: 2015-05-19
  Administered 2015-05-19: 30 mg via INTRAVENOUS

## 2015-05-19 MED ORDER — HYDROCODONE-ACETAMINOPHEN 5-325 MG PO TABS *I*
1.0000 | ORAL_TABLET | Freq: Four times a day (QID) | ORAL | 0 refills | Status: DC | PRN
Start: 2015-05-19 — End: 2015-06-10

## 2015-05-19 MED ORDER — CEFAZOLIN 2000 MG IN STERILE WATER 20ML SYRINGE *I*
PREFILLED_SYRINGE | INTRAVENOUS | Status: AC
Start: 2015-05-19 — End: 2015-05-19
  Filled 2015-05-19: qty 20

## 2015-05-19 MED ORDER — SUGAMMADEX SODIUM 100 MG/1ML IV SOLN *WRAPPED*
INTRAVENOUS | Status: AC
Start: 2015-05-19 — End: 2015-05-19
  Filled 2015-05-19: qty 2

## 2015-05-19 MED ORDER — LACTATED RINGERS IV SOLN *I*
100.0000 mL/h | INTRAVENOUS | Status: DC
Start: 2015-05-19 — End: 2015-05-20

## 2015-05-19 MED ORDER — LIDOCAINE HCL 1 % IJ SOLN *I*
INTRAMUSCULAR | Status: AC
Start: 2015-05-19 — End: 2015-05-19
  Filled 2015-05-19: qty 2

## 2015-05-19 MED ORDER — ROCURONIUM BROMIDE 10 MG/ML IV SOLN *WRAPPED*
Status: DC | PRN
Start: 2015-05-19 — End: 2015-05-19
  Administered 2015-05-19: 25 mg via INTRAVENOUS
  Administered 2015-05-19: 5 mg via INTRAVENOUS

## 2015-05-19 SURGICAL SUPPLY — 30 items
ADHESIVE DERMABOND GLUE HI VISCOSITY (Dressing) ×1
ADHESIVE SKIN CLOSURE 0.7ML DERMABOND ADVANCED (Dressing) ×2 IMPLANT
BALLOON DISS PREPERITONEAL (Supply) ×3 IMPLANT
CAUTERY BOVIE SURGICAL PENCIL (Supply) ×3 IMPLANT
COVER OR LIGHT DISP CAMERA (Supply) ×3 IMPLANT
FASTENER SORBAFIX 15 SHOT DISP (Supply) IMPLANT
GLOVE BIOGEL PI INDICATOR SZ 7.5 LF (Glove) ×2 IMPLANT
GLOVE BIOGEL PI MICRO IND UNDER SZ 6.5 LF (Glove) ×2 IMPLANT
GLOVE BIOGEL PI MICRO IND UNDER SZ 7.0 LF (Glove) ×2 IMPLANT
GLOVE BIOGEL PI MICRO IND UNDER SZ 7.5 LF (Glove) ×2 IMPLANT
GLOVE BIOGEL PI MICRO SZ 7 (Glove) ×2 IMPLANT
GLOVE BIOGEL PI MICRO SZ 7.5 (Glove) ×4 IMPLANT
GLOVE LINER BIOGEL INDICATOR SZ7 LTX (Glove) ×2 IMPLANT
GLOVE SURG BIOGEL ECLIPSE SZ8.5 LTX PF (Glove) ×3 IMPLANT
GOWN SIRIUS RAGLAN NONREINFORCED L (Gown) ×4 IMPLANT
KIT LAPAROSCOPIC TO OPEN (Supply) IMPLANT
MESH HERN L W10.8XL16CM L INGUINAL WHT POLYPR MFIL NONABSORBABLE LAP 3D MAX (Implant) ×2 IMPLANT
MESH HERN L W10.8XL16CM R INGUINAL WHT POLYPR MFIL NONABSORBABLE LAP 3D MAX (Implant) ×2 IMPLANT
PACK CUSTOM GENERAL LAPAROSCOPY (Pack) ×3 IMPLANT
SLEEVE COMP KNEE HI MED (Supply) ×3 IMPLANT
SLEEVE XCEL ENDOPATH 5 X 100MM (Other) ×3 IMPLANT
SOL SOD CHL IRRIG .9PCT 1000ML BAG (Solution) ×1 IMPLANT
SOL SOD CHL IRRIG 500ML BTL (Solution) ×3 IMPLANT
SPIKE MINI DISPENSING PIN (BBRAUN DP1000) (Supply) ×3 IMPLANT
SUTR MONOCRYL 4-0 PS-2 18 IN UNDY (Suture) ×6 IMPLANT
SUTR VICRYL 0 UR-6 CT 27 IN (Suture) ×3 IMPLANT
SYRINGE ONLY 30ML SLIP TIP (Supply) ×6 IMPLANT
SYSTEM PERM FIX L37CM 15 FAST CAPSUR (Supply) IMPLANT
TROCAR STRUCTURAL BLN 10MM (Other) ×3 IMPLANT
TROCAR XCEL ENDOPATH BLADELESS 5X100MM (Other) ×3 IMPLANT

## 2015-05-19 NOTE — Interval H&P Note (Signed)
UPDATES TO PATIENT'S CONDITION on the DAY OF SURGERY/PROCEDURE    I. Updates to Patient's Condition (to be completed by a provider privileged to complete a H&P, following reassessment of the patient by the provider):    Day of Surgery/Procedure Update:  History  History reviewed and no change    Physical  Physical exam updated and no change            II. Procedure Readiness   I have reviewed the patient's H&P and updated condition. By completing and signing this form, I attest that this patient is ready for surgery/procedure.    III. Attestation   I have reviewed the updated information regarding the patient's condition and it is appropriate to proceed with the planned surgery/procedure.    Charolotte Eke, MD as of 9:44 AM 05/19/2015

## 2015-05-19 NOTE — Anesthesia Case Conclusion (Signed)
CASE CONCLUSION  [facility]  Actions:  Suctioned and extubated  Criteria Used for Airway Removal:  Adequate Tv & RR, acceptable O2 saturation and following commands  Assessment:  Routine  Transport  Directly to: PACU  Airway:  Nasal cannula  Oxygen Delivery:  2 lpm  Monitoring:  Pulse oximetry  Position:  Supine  Patient Condition on Handoff  Level of Consciousness:  Alert/talking/calm  Patient Condition:  Stable  Handoff Report to:  RN

## 2015-05-19 NOTE — Anesthesia Postprocedure Evaluation (Signed)
Anesthesia Post-Op Note    Patient: Gary Mcgrath    Procedure(s) Performed:  Procedure Summary     Date Anesthesia Start Anesthesia Stop Room / Location    05/19/15 1025 [id number] [id number] / [facility] MAIN OR       Procedure Diagnosis Surgeon Attending Anesthesia    LAPAROSCOPIC INGUINAL HERNIA REPAIR (Bilateral Groin) Inguinal hernia  (Inguinal hernia [K40.90]) Schoeniger, Lurena Joiner, MD Idalia Needle, MD        Recovery Vitals  BP: 137/89 (05/19/2015 12:15 PM)  Heart Rate: 76 (05/19/2015 12:15 PM)  Heart Rate (via Pulse Ox): 75 (05/19/2015 12:15 PM)  Resp: 18 (05/19/2015 12:30 PM)  Temp: 36.3 C (97.3 F) (05/19/2015 12:30 PM)  SpO2: 100 % (05/19/2015 12:15 PM)  O2 Device: Nasal cannula (05/19/2015 12:30 PM)  O2 Flow Rate: 1 L/min (05/19/2015 12:30 PM)   0-10 Scale: 2 (05/19/2015 12:30 PM)  Anesthesia type:  General  Complications Noted During Procedure or in PACU:  None   Comment:    Patient Location:  PACU  Level of Consciousness:    Awake  Patient Participation:     Able to participate  Temperature Status:    Normothermic  Oxygen Saturation:    Within patient's normal range  Cardiac Status:   Stable  Fluid Status:    Stable  Airway Patency:     Yes  Pulmonary Status:    Baseline  Pain Management:    Adequate analgesia  Nausea and Vomiting:  None  Comments:       Post Op Assessment:    Tolerated procedure well and complications noted below:   Attending Attestation:  All indicated post anesthesia care provided     -

## 2015-05-19 NOTE — Discharge Instructions (Signed)
PATIENT INSTRUCTIONS  HERNIA    FOLLOW-UP:  Please make an appointment with your physician in 3 week(s).  Call your physician immediately if you have any fevers greater than 102.5, drainage from you wound that is not clear or looks infected, persistent bleeding, increasing abdominal pain, problems urinating, or persistent nausea/vomiting.      WOUND CARE INSTRUCTIONS:  Keep a dry clean dressing on the wound if there is drainage. If clothing rubs against the wound or causes irritation and the wound is not draining you may cover it with a dry dressing during the daytime.  Try to keep the wound dry and avoid ointments on the wound unless directed to do so.  If the wound becomes bright red and painful or starts to drain infected material that is not clear, please contact your physician immediately.  If the wound is mildly pink and has a thick firm ridge underneath it, this is normal, and is referred to as a healing ridge.  This will resolve over the next 4-6 weeks.    DIET:  You may eat any foods that you can tolerate.  It is a good idea to eat a high fiber diet and take in plenty of fluids to prevent constipation.  If you do become constipated you may want to take a mild laxative or take ducolax tablets on a daily basis until your bowel habits are regular.  Constipation can be very uncomfortable, along with straining, after recent abdominal surgery.    ACTIVITY:  You are encouraged to cough and deep breath or use your incentive spirometer if you were given one, every 15-30 minutes when awake.  This will help prevent respiratory complications and low grade fevers post-operatively.  You may want to hug a pillow when coughing and sneezing to add additional support to the surgical area which will decrease pain during these times.  You are encouraged to walk and engage in light activity for the next two weeks.  You should not lift more than 20 pounds during this time frame as it could put you at increased risk for a hernia  recurrence.  Twenty pounds is roughly equivalent to a plastic bag of groceries.      MEDICATIONS:  Try to take narcotic medications and anti-inflammatory medications, such as tylenol, ibuprofen, naprosyn, etc., with food.  This will minimize stomach upset from the medication.  Should you develop nausea and vomiting from the pain medication, or develop a rash, please discontinue the medication and contact your physician.  You should not drive, make important decisions, or operate machinery when taking narcotic pain medication.    QUESTIONS:  Please feel free to call your physician or the hospital operator if you have any questions, and they will be glad to assist you.

## 2015-05-19 NOTE — Op Note (Signed)
Operative Note (Surgical Log ID: [id number])       Date of Surgery: 05/19/2015       Surgeons: Juliann Mule) and Role:     * Iyonnah Ferrante, Lurena Joiner, MD - Primary     * Posey Pronto, Laureen Ochs, MD     * Deirdre Evener, MD - Resident - Assisting       Pre-op Diagnosis: Pre-Op Diagnosis Codes:     * Inguinal hernia [K40.90]       Post-op Diagnosis: Post-Op Diagnosis Codes:     * Inguinal hernia [K40.90]       Procedure(s) Performed: Procedure:    LAPAROSCOPIC INGUINAL HERNIA REPAIR  CPT(R) Code:  [id number] - [place]         Additional CPT Codes:        Anesthesia Type: General        Fluid Totals: I/O this shift:  03/27 0600 - 03/27 2259  In: 1100 (18 mL/kg) [I.V.:1100]  Out: - (0 mL/kg)   Net: 1100  Weight: 61 kg        Estimated Blood Loss: No Data Recorded       Specimens to Pathology:  * No specimens in log *       Temporary Implants:        Packing:                 Patient Condition: good       Indications: pain       Findings (Including unexpected complications): BIH     Description of Procedure:   INDICATIONS:  The patient had bilateral   symptomatic  inguinal herniae and informed consent was obtained for this procedure following a full discussion of risks, benefits, and alternatives including expectant management and open repair.    SURGEON'S NARRATIVE:  Following general anesthesia in the supine position, the patient's abdomen was prepped and draped in the usual sterile fashion, which included using clippers for hair removal, intravenous/IV antibiotic prophylaxis within 60 minutes of skin incision, ChloraPrep, and a surgical timeout including site marking.  We cut down through an umbilical incision to the left rectus sheath, which was transversely incised, and the properitoneal space was identified in the midline and dissected to the pubis.  A dissecting balloon was then inserted and deployed according to the manufacturer's instructions under laparoscopic guidance and then removed and exchanged for  the structural balloon and the patient underwent pneumoproperitoneum.     Under direct vision, two 5 mm midline ports were placed.  Blunt dissection developed the space of Retzius. The left peritoneum was identified laterally at the level of the iliac fossa and was peeled away from the pelvic brim extending in a wide sulcus over the iliac vessels from lateral to medial.   The cord structures were identified and preserved without circumferential dissection. The large direct hernia sac was reduced.    Mirror image dissection took place on the right side where a direct hernia was confirmed.  A wide sulcus extending from the iliac fossa to the space of Retzius was developed in the properitoneal plane.    A Davol 3DMax large right mesh was then inserted and deployed according to the manufacturer's instructions in a highly-satisfactory manner.  It overlapped the midline.   A Davol 3D Max large left mesh was then inserted.   It was not felt that any tacks were needed, as  the disposition was superlative.  CO2 was very carefully released under direct vision and the  trocars were withdrawn.      The umbilical fascia was closed using running 0 Vicryl.   The skin was closed using 4-0 Monocryl and Dermabond.  An ilioinguinal block was placed with Marcaine.  Dr. Dennard Schaumann was present throughout.  Blood loss was 5 cc.  The patient tolerated the procedure well.      Signed:  Charolotte Eke, MD  on 05/19/2015 at 11:27 AM

## 2015-05-19 NOTE — Anesthesia Procedure Notes (Signed)
---------------------------------------------------------------------------------------------------------------------------------------    AIRWAY   GENERAL INFORMATION AND STAFF    Patient location during procedure: OR       Date of Procedure: 05/19/2015 10:36 AM  CONDITION PRIOR TO MANIPULATION     Current Airway/Neck Condition:  Normal        For more airway physical exam details, see Anesthesia PreOp Evaluation  AIRWAY METHOD     Patient Position:  Sniffing    Preoxygenated: yes      Induction: IV    Mask Difficulty Assessment:  1 - vent by mask       Mask NMB: 1 - vent by mask      Technique Used for Successful ETT Placement:  Direct laryngoscopy    Blade Type:  Macintosh    Laryngoscope Blade/Video laryngoscope Blade Size:  3    Cormack-Lehane Classification:  Grade IIa - partial view of glottis    Placement Verified by: capnometry, auscultation, palpation of cuff and equal breath sounds      Number of Attempts at Approach:  1    Number of Other Approaches Attempted:  0  FINAL AIRWAY DETAILS    Final Airway Type:  Endotracheal airway    Final Endotracheal Airway:  ETT      Cuffed: cuffed    Insertion Site:  Oral    ETT Size (mm):  7.5    Cuff Volume (mL):  6    Distance inserted from Teeth (cm):  23  ----------------------------------------------------------------------------------------------------------------------------------------

## 2015-05-19 NOTE — H&P (View-Only) (Signed)
Dear Dr. Moreen Fowler,    We had the pleasure of seeing Gary Mcgrath in surgery clinic for continuing follow up and definitive surgical planning.     Gary Mcgrath is a 65 y.o. male with a past medical history significant for BPH, elevated PSA (prostate biopsy neg for malignancy: 03/2015) and bilateral inguinal hernia.     The patient was initially seen in September 2016 with complaint of enlargement and pain of a known left inguinal hernia was diagnosed by his PCP 10 years prior. Physical exam findings confirmed left inguinal hernia and a small right inguinal hernia.  Laparoscopic repair was recommended.  At that time he was undergoing evaluation for possible prostate cancer. He demonstrated minimal symptoms and it was deemed safe for him to postpone surgery until prostate cancer evaluation completed.     He was last seen in our office in January 2017 with request to proceed with surgery. Recent prostate biopsy was negative     He presents today with his wife. They have concerns in regards to his upcoming surgery.  From a clinical standpoint he reports noticing some enlargement of the bulge and increasing discomfort with prolonged standing. He denies GI obstructive symptoms.  He does have BPH with nocturia x 2.  He denies chronic cough or constipation    Review of Systems:  A comprehensive review of systems was negative except as above    Gary Mcgrath  has a past medical history of Bilateral inguinal hernia and Enlarged prostate.    He  has a past surgical history that includes Appendectomy; Colonoscopy (2016); and ostate biopsy (03/2014 & 12/2014, 03/2015).    He currently has no medications in their medication list.    He is allergic to epinephrine.    He  reports that he has never smoked. He has never used smokeless tobacco.    Gary Mcgrath  reports that he drinks alcohol.     Family history includes Alzheimer's disease in his father; Breast cancer in his mother; Hypertension in his brother and father.    Speaks  [place].  Understands/speaks English. He is employed as a Personal assistant.  He is married and lives with his wife.  They have 1 son.     Physical Exam:  Gary Mcgrath's  height is 167.6 cm (5\' 6" ) and weight is 61.2 kg (135 lb). His temporal temperature is 36.2 C (97.2 F). His blood pressure is 128/67 and his pulse is 86. His respiration is 16 and oxygen saturation is 98%.      General Appearance:    Alert, cooperative, no distress, appears stated age   Head:    Normocephalic, without obvious abnormality, atraumatic   Eyes:    PERRL, conjunctiva/corneas clear, EOM's intact, benign, both eyes        Throat:   Lips, mucosa, and tongue normal; teeth and gums normal   Neck:   Supple, symmetrical, trachea midline, no adenopathy;        thyroid:  No enlargement/tenderness/nodules; no carotid    bruit or JVD   Lungs:     Clear to auscultation bilaterally, respirations unlabored   Heart:    Regular rate and rhythm, S1 and S2 normal, no murmur, rub   or gallop   Abdomen:     Soft, non-tender, bowel sounds active all four quadrants,     no masses, no organomegaly. Healed RLQ incision.  Obvious left inguinal hernia. Small right inguinal hernia on Valsalva.       Extremities:   Extremities normal, atraumatic, no  cyanosis or edema      Assessment:      65 year old male with bilateral inguinal hernia (left > right).  Surgical repair is planned for 05/19/2015.  The patient and wife have additional questions regarding risks of surgery and possible alternatives. Dr. Dennard Schaumann to obtain consent day of surgery      Plan:      1. Discussed the risk of surgery including bleeding, infection, blood clots, injury to adjacent structures, recurrence, seroma/hematoma, urinary retention, and the risks of general anesthetic including MI, CVA, sudden death or even reaction to anesthetic medications. The patient understands the risks, any and all questions were answered to the patient's satisfaction.  2.Preoperative teaching completed, patient verbalizes  understanding of instructions, he does not object to blood transfusion, preoperative orders completed. Patient to discontinue ASA/NSAIDS 7 days prior to surgery.     Although patient speaks/understands English there was a mild language barrier, his wife served as interpreter during the history-taking and subsequent discussion with this patient.    He is anxious to proceed and will be seen again in follow up two weeks after surgery.    Best wishes,  Gary Baumgartner, NP  05/14/2015

## 2015-05-20 ENCOUNTER — Encounter: Payer: Self-pay | Admitting: Surgical Oncology

## 2015-05-28 LAB — TSH: TSH: 1.81 u[IU]/mL (ref 0.41–5.90)

## 2015-05-28 LAB — VITAMIN D 25 HYDROXY (VIT D DEFICIENCY, FRACTURES): Vit D, 25-Hydroxy: 19

## 2015-06-10 ENCOUNTER — Ambulatory Visit: Payer: Self-pay | Admitting: Surgery

## 2015-06-10 ENCOUNTER — Encounter: Payer: Self-pay | Admitting: Surgical Oncology

## 2015-06-10 ENCOUNTER — Telehealth: Payer: Self-pay | Admitting: Surgery

## 2015-06-10 VITALS — BP 117/75 | HR 72 | Temp 98.1°F | Resp 16 | Ht 66.0 in | Wt 136.7 lb

## 2015-06-10 DIAGNOSIS — Z8719 Personal history of other diseases of the digestive system: Secondary | ICD-10-CM

## 2015-06-10 NOTE — Patient Instructions (Signed)
Call about your paperwork [phone]

## 2015-06-10 NOTE — Telephone Encounter (Signed)
Wants to discuss he is not ready to return to work with no restrictions. He is still complaining of abdominal discomfort

## 2015-06-10 NOTE — Progress Notes (Signed)
[facility] Office Note      Date of visit 06/10/2015      HISTORY OF PRESENT ILLNESS:   Gary Mcgrath is a 65 y.o. male with a past medical history significant for BPH, elevated PSA (prostate biopsy neg for malignancy: 03/2015) and bilateral inguinal hernia.     The patient was initially seen in September 2016 with complaint of enlargement and pain of a known left inguinal hernia was diagnosed by his PCP 10 years prior. Physical exam findings confirmed left inguinal hernia and a small right inguinal hernia. Laparoscopic repair was recommended. At that time he was undergoing evaluation for possible prostate cancer. He demonstrated minimal symptoms and it was deemed safe for him to postpone surgery until prostate cancer evaluation completed.     He was last seen in our office in January 2017 with request to proceed with surgery. Recent prostate biopsy was negative     Today he is post op day 22 s/p bilateral lap hernia repair.    He presents today with his wife. They have concerns in regards to his upcoming surgery. From a clinical standpoint he reports noticing some enlargement of the bulge and increasing discomfort with prolonged standing. He denies GI obstructive symptoms. He does have BPH with nocturia x 2. He denies chronic cough or constipation    Past Medical History:   Past Medical History:   Diagnosis Date    Bilateral inguinal hernia     Enlarged prostate        Medications:   No current outpatient prescriptions on file.    Allergies:  Allergy History as of 06/10/15     NO KNOWN DRUG ALLERGY       Noted Status Severity Type Reaction    03/18/15 0920 Adela Glimpse R, NP 11/02/06 Deleted       06/08/10 1324 Edi Conversion, Allergies 11/02/06 Active       Comments:  Created by Conversion - 0;            EPINEPHRINE       Noted Status Severity Type Reaction    05/12/15 1156 Mackey Birchwood, RN 11/19/14 Active Medium Intolerance Other (See Comments)    Comments:  During a dental procedure-Fainting       05/09/15 1629 Marlyce Huge, RN 11/19/14 Active Medium Intolerance Other (See Comments)    Comments:  Fainting      11/19/14 1300 Royden Purl 11/19/14 Active High Allergy Other (See Comments)    Comments:  Fainting                  Social:  Social History     Social History    Marital status: Married     Spouse name: N/A    Number of children: N/A    Years of education: N/A     Occupational History    Not on file.     Social History Main Topics    Smoking status: Never Smoker    Smokeless tobacco: Never Used    Alcohol use Yes      Comment: social drinker    Drug use: No    Sexual activity: Not on file     Social History Narrative    He is employed as a Personal assistant.  He is married and lives with his wife.  They have 1 son. His is active,            Review of Systems:   Review of Systems   Constitutional:  Negative for chills, fever and malaise/fatigue.   Respiratory: Negative for cough, shortness of breath and wheezing.    Cardiovascular: Negative for chest pain, palpitations and leg swelling.   Gastrointestinal: Positive for constipation. Negative for abdominal pain, diarrhea, heartburn, nausea and vomiting.   Neurological: Positive for tingling. Negative for weakness.       EXAM:   Vitals:    06/10/15 1009   BP: 117/75   BP Location: Left arm   Patient Position: Sitting   Cuff Size: adult   Pulse: 72   Resp: 16   Temp: 36.7 C (98.1 F)   TempSrc: Temporal   SpO2: 99%   Weight: 62 kg (136 lb 11.2 oz)   Height: 1.676 m (5\' 6" )     Physical Exam   Constitutional: He is well-developed, well-nourished, and in no distress. No distress.   HENT:   Head: Normocephalic and atraumatic.   Eyes: Pupils are equal, round, and reactive to light. No scleral icterus.   Cardiovascular: Normal rate, regular rhythm, normal heart sounds and intact distal pulses.  Exam reveals no friction rub.    No murmur heard.  Pulmonary/Chest: Effort normal. No respiratory distress. He has no wheezes. He has no rales.   Abdominal:  Soft. He exhibits no distension and no mass. There is no tenderness. There is no rebound and no guarding.       Skin: He is not diaphoretic.       ASSESSMENT AND PLAN:  Patient is 65 y.o. male bilateral inguinal hernia (left > right). Laparoscopic bilateral inguinal hernia 05/19/2015 with mesh. No evidence of reoccurrence. From a surgical standpoint, healing very well.    May return to work today.  Follow up as needed.

## 2016-05-14 ENCOUNTER — Telehealth: Payer: Self-pay | Admitting: Internal Medicine

## 2016-05-14 NOTE — Telephone Encounter (Signed)
Patient requesting for Dr. Camila Li to take him on as a patient.   Can call wife, Dow Adolph at [phone].

## 2016-05-26 DIAGNOSIS — Z0000 Encounter for general adult medical examination without abnormal findings: Secondary | ICD-10-CM | POA: Diagnosis not present

## 2016-05-27 NOTE — Telephone Encounter (Signed)
Pts wife called back checking to see if Dr Camila Li would see her husband. She said that he does not speak a lot of English which is why he would like to see Dr. Camila Li. She called on March 23rd but has not heard anything back. Please advise.

## 2016-05-28 NOTE — Telephone Encounter (Signed)
Routing to dr plotnikov, please advise, thanks 

## 2016-06-01 NOTE — Telephone Encounter (Signed)
I have advised carissa, wife of patient---ok to schedule per dr Alain Marion, she has made appt for patient

## 2016-06-01 NOTE — Telephone Encounter (Signed)
OK. Thx

## 2016-06-29 ENCOUNTER — Other Ambulatory Visit (INDEPENDENT_AMBULATORY_CARE_PROVIDER_SITE_OTHER): Payer: PPO

## 2016-06-29 ENCOUNTER — Encounter: Payer: Self-pay | Admitting: Internal Medicine

## 2016-06-29 ENCOUNTER — Ambulatory Visit (INDEPENDENT_AMBULATORY_CARE_PROVIDER_SITE_OTHER): Payer: PPO | Admitting: Internal Medicine

## 2016-06-29 VITALS — BP 122/84 | HR 67 | Temp 98.4°F | Ht 66.25 in | Wt 144.0 lb

## 2016-06-29 DIAGNOSIS — E785 Hyperlipidemia, unspecified: Secondary | ICD-10-CM | POA: Diagnosis not present

## 2016-06-29 DIAGNOSIS — N320 Bladder-neck obstruction: Secondary | ICD-10-CM | POA: Diagnosis not present

## 2016-06-29 DIAGNOSIS — E559 Vitamin D deficiency, unspecified: Secondary | ICD-10-CM

## 2016-06-29 DIAGNOSIS — R972 Elevated prostate specific antigen [PSA]: Secondary | ICD-10-CM | POA: Insufficient documentation

## 2016-06-29 DIAGNOSIS — Z0000 Encounter for general adult medical examination without abnormal findings: Secondary | ICD-10-CM | POA: Insufficient documentation

## 2016-06-29 LAB — BASIC METABOLIC PANEL
BUN: 11 mg/dL (ref 6–23)
CALCIUM: 9.2 mg/dL (ref 8.4–10.5)
CHLORIDE: 102 meq/L (ref 96–112)
CO2: 32 meq/L (ref 19–32)
Creatinine, Ser: 1.04 mg/dL (ref 0.40–1.50)
GFR: 75.87 mL/min (ref >60.00)
GLUCOSE: 103 mg/dL — AB (ref 70–99)
POTASSIUM: 4 meq/L (ref 3.5–5.1)
SODIUM: 140 meq/L (ref 135–145)

## 2016-06-29 LAB — CBC WITH DIFFERENTIAL/PLATELET
BASOS ABS: 0.1 10*3/uL (ref 0.0–0.1)
Basophils Relative: 1.3 % (ref 0.0–3.0)
EOS PCT: 2.7 % (ref 0.0–5.0)
Eosinophils Absolute: 0.1 10*3/uL (ref 0.0–0.7)
HEMATOCRIT: 48.5 % (ref 39.0–52.0)
Hemoglobin: 16 g/dL (ref 13.0–17.0)
LYMPHS ABS: 1 10*3/uL (ref 0.7–4.0)
LYMPHS PCT: 18.9 % (ref 12.0–46.0)
MCHC: 32.9 g/dL (ref 30.0–36.0)
MCV: 86 fl (ref 78.0–100.0)
MONOS PCT: 8.2 % (ref 3.0–12.0)
Monocytes Absolute: 0.4 10*3/uL (ref 0.1–1.0)
NEUTROS ABS: 3.8 10*3/uL (ref 1.4–7.7)
Neutrophils Relative %: 68.9 % (ref 43.0–77.0)
PLATELETS: 186 10*3/uL (ref 150.0–400.0)
RBC: 5.64 Mil/uL (ref 4.22–5.81)
RDW: 16.2 % — ABNORMAL HIGH (ref 11.5–15.5)
WBC: 5.5 10*3/uL (ref 4.0–10.5)

## 2016-06-29 LAB — LIPID PANEL
CHOLESTEROL: 219 mg/dL — AB (ref 0–200)
HDL: 51.7 mg/dL (ref >39.00)
LDL Cholesterol: 129 mg/dL — ABNORMAL HIGH (ref 0–99)
NONHDL: 166.95
Total CHOL/HDL Ratio: 4
Triglycerides: 189 mg/dL — ABNORMAL HIGH (ref 0.0–149.0)
VLDL: 37.8 mg/dL (ref 0.0–40.0)

## 2016-06-29 LAB — TSH: TSH: 1.57 u[IU]/mL (ref 0.35–4.50)

## 2016-06-29 LAB — URINALYSIS
BILIRUBIN URINE: NEGATIVE
HGB URINE DIPSTICK: NEGATIVE
KETONES UR: NEGATIVE
LEUKOCYTES UA: NEGATIVE
Nitrite: NEGATIVE
Specific Gravity, Urine: 1.01 (ref 1.000–1.030)
Total Protein, Urine: NEGATIVE
URINE GLUCOSE: NEGATIVE
UROBILINOGEN UA: 0.2 (ref 0.0–1.0)
pH: 7.5 (ref 5.0–8.0)

## 2016-06-29 LAB — HEPATIC FUNCTION PANEL
ALBUMIN: 4.2 g/dL (ref 3.5–5.2)
ALK PHOS: 86 U/L (ref 39–117)
ALT: 19 U/L (ref 0–53)
AST: 16 U/L (ref 0–37)
Bilirubin, Direct: 0.2 mg/dL (ref 0.0–0.3)
TOTAL PROTEIN: 6.4 g/dL (ref 6.0–8.3)
Total Bilirubin: 1.2 mg/dL (ref 0.2–1.2)

## 2016-06-29 LAB — VITAMIN D 25 HYDROXY (VIT D DEFICIENCY, FRACTURES): VITD: 44.8 ng/mL (ref 30.00–100.00)

## 2016-06-29 LAB — PSA: PSA: 15.22 ng/mL — AB (ref 0.10–4.00)

## 2016-06-29 MED ORDER — ZOSTER VAC RECOMB ADJUVANTED 50 MCG/0.5ML IM SUSR: 0.5000 mL | Freq: Once | INTRAMUSCULAR | 1 refills | Status: AC

## 2016-06-29 NOTE — Assessment & Plan Note (Signed)

## 2016-06-29 NOTE — Assessment & Plan Note (Addendum)
2016 PSA 3.5 - had (+) bx - low Gleason score - watchful waiting after 3 bx; Last bx was (-) Dr Nyra Capes [facility]  Urol ref Labs

## 2016-06-29 NOTE — Progress Notes (Signed)
Pre visit review using our clinic review tool, if applicable. No additional management support is needed unless otherwise documented below in the visit note. 

## 2016-06-29 NOTE — Assessment & Plan Note (Signed)
Labs

## 2016-06-29 NOTE — Assessment & Plan Note (Signed)
On Vit D 

## 2016-06-29 NOTE — Progress Notes (Signed)
   Subjective:  Patient ID: Antonio Yu, male    DOB: 1951-02-10  Age: 66 y.o. MRN: [id number]  CC: No chief complaint on file.   HPI Antonio Yu presents for a new pt well visit  No outpatient prescriptions prior to visit.   No facility-administered medications prior to visit.     ROS Review of Systems  Constitutional: Negative for appetite change, fatigue and unexpected weight change.  HENT: Negative for congestion, nosebleeds, sneezing, sore throat and trouble swallowing.   Eyes: Negative for itching and visual disturbance.  Respiratory: Negative for cough.   Cardiovascular: Negative for chest pain, palpitations and leg swelling.  Gastrointestinal: Negative for abdominal distention, blood in stool, diarrhea and nausea.  Genitourinary: Negative for frequency and hematuria.  Musculoskeletal: Negative for back pain, gait problem, joint swelling and neck pain.  Skin: Negative for rash.  Neurological: Negative for dizziness, tremors, speech difficulty and weakness.  Psychiatric/Behavioral: Negative for agitation, dysphoric mood, sleep disturbance and suicidal ideas. The patient is not nervous/anxious.     Objective:  BP 122/84 (BP Location: Left Arm, Patient Position: Sitting, Cuff Size: Normal)   Pulse 67   Temp 98.4 F (36.9 C) (Oral)   Ht 5' 6.25" (1.683 m)   Wt 144 lb 0.6 oz (65.3 kg)   SpO2 98%   BMI 23.07 kg/m   BP Readings from Last 3 Encounters:  06/29/16 122/84    Wt Readings from Last 3 Encounters:  06/29/16 144 lb 0.6 oz (65.3 kg)    Physical Exam  Constitutional: He is oriented to person, place, and time. He appears well-developed. No distress.  NAD  HENT:  Mouth/Throat: Oropharynx is clear and moist.  Eyes: Conjunctivae are normal. Pupils are equal, round, and reactive to light.  Neck: Normal range of motion. No JVD present. No thyromegaly present.  Cardiovascular: Normal rate, regular rhythm, normal heart sounds and intact distal  pulses.  Exam reveals no gallop and no friction rub.   No murmur heard. Pulmonary/Chest: Effort normal and breath sounds normal. No respiratory distress. He has no wheezes. He has no rales. He exhibits no tenderness.  Abdominal: Soft. Bowel sounds are normal. He exhibits no distension and no mass. There is no tenderness. There is no rebound and no guarding.  Musculoskeletal: Normal range of motion. He exhibits no edema or tenderness.  Lymphadenopathy:    He has no cervical adenopathy.  Neurological: He is alert and oriented to person, place, and time. He has normal reflexes. No cranial nerve deficit. He exhibits normal muscle tone. He displays a negative Romberg sign. Coordination and gait normal.  Skin: Skin is warm and dry. No rash noted.  Psychiatric: He has a normal mood and affect. His behavior is normal. Judgment and thought content normal.  Pt declined rectal exam  No results found for: WBC, HGB, HCT, PLT, GLUCOSE, CHOL, TRIG, HDL, LDLDIRECT, LDLCALC, ALT, AST, NA, K, CL, CREATININE, BUN, CO2, TSH, PSA, INR, GLUF, HGBA1C, MICROALBUR  Patient was never admitted.  Assessment & Plan:   There are no diagnoses linked to this encounter. I am having Mr. Kunzler maintain his Vitamin D and vitamin B-12.  Meds ordered this encounter  Medications  . Cholecalciferol (VITAMIN D) 2000 units CAPS    Sig: Take by mouth.  . vitamin B-12 (CYANOCOBALAMIN) 1000 MCG tablet    Sig: Take 1,000 mcg by mouth daily.     Follow-up: No Follow-up on file.  Walker Kehr, MD

## 2016-07-08 ENCOUNTER — Other Ambulatory Visit: Payer: Self-pay | Admitting: Internal Medicine

## 2016-07-08 LAB — COMPREHENSIVE METABOLIC PANEL (CC13)
ALBUMIN: 4
ALT: 19
ANION GAP: 5
AST: 18 U/L
Albumin/Globulin Ratio: 1.7
Alkaline Phosphatase: 103 U/L
BILIRUBIN TOTAL: 0.9
BUN / CREAT RATIO: 15.6
BUN: 14
CO2: 33 mmol/L
CREATININE: 0.9
Calcium: 8.9 mg/dL
Chloride: 104 mmol/L
EGFR (African American): >60
GLOBULIN: 2.4
GLUCOSE: 97
Potassium: 4.6 mmol/L
SODIUM: 142
Total Protein: 6.4 g/dL

## 2016-07-08 LAB — CBC WITH DIFFERENTIAL
BASOPHILS: 2
BASOS ABS: 0.1
EOS PCT: 3
Eosinophil #: 0.1
HCT: 44
HGB: 14.4 g/dL
LYMPHS ABS: 1.2
Lymphocytes: 31
MCH: 28.5
MCHC: 32.7
MCV: 87
MONO ABS: 0.3
Monocytes: 8
NEUTROS ABS: 2.2
NEUTROS PCT: 56
PLATELETS: 186 10*3/uL
RBC: 5.06
RDW: 15.9
WBC: 3.9

## 2016-07-08 LAB — HEPATITIS PANEL, ACUTE
Hep B Core Ab, Tot: NEGATIVE
Hep C Virus Ab: NEGATIVE

## 2016-07-16 ENCOUNTER — Encounter: Payer: Self-pay | Admitting: Internal Medicine

## 2016-10-20 DIAGNOSIS — C61 Malignant neoplasm of prostate: Secondary | ICD-10-CM | POA: Diagnosis not present

## 2016-10-27 ENCOUNTER — Other Ambulatory Visit: Payer: Self-pay | Admitting: Urology

## 2016-10-27 DIAGNOSIS — C61 Malignant neoplasm of prostate: Secondary | ICD-10-CM

## 2016-11-08 ENCOUNTER — Ambulatory Visit
Admission: RE | Admit: 2016-11-08 | Discharge: 2016-11-08 | Disposition: A | Discharge status: Home / Self Care | Payer: PPO | Source: Ambulatory Visit | Attending: Urology | Admitting: Urology

## 2016-11-08 DIAGNOSIS — C61 Malignant neoplasm of prostate: Secondary | ICD-10-CM

## 2016-11-08 IMAGING — MR MR PROSTATE WO/W CM
56 series · 56 of 56 positions shown · IV contrast (13ml multihance)
Comparison: None available.

315 labs

Bun 13

Creat.

CLINICAL DATA: Prostate carcinoma diagnosed [3K]. Most recent PSA
equal 15.2.

EXAM:
MR PROSTATE WITHOUT AND WITH CONTRAST
TECHNIQUE: Multiplanar multisequence MRI images were obtained of the pelvis
centered about the prostate. Pre and post contrast images were
obtained.
CONTRAST:  13mL MULTIHANCE GADOBENATE DIMEGLUMINE 529 MG/ML IV SOLN

[Series 3: T1 · axial · 8.0mm · 1.06mm/px · 1 of 28 slices shown (1 of 2)]
[im 1/28]
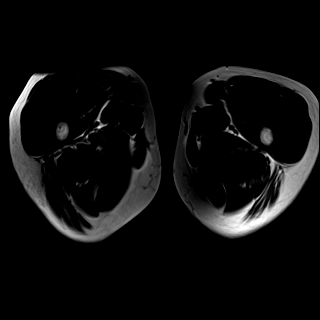

[Series 4: T2 · coronal · 3.5mm · 0.56mm/px · 1 of 24 slices shown (1 of 4)]
[im 1/24]
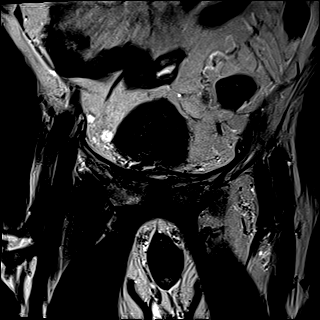

[Series 5: T2 · sagittal · 3.5mm · 0.56mm/px · 1 of 39 slices shown (2 of 4)]
[im 1/39]
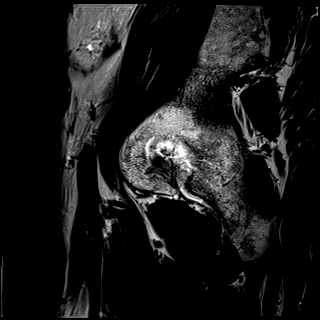

[Series 6: bSSFP fat-sat · axial · 8.0mm · 0.74mm/px · 1 of 28 slices shown]
[im 1/28]
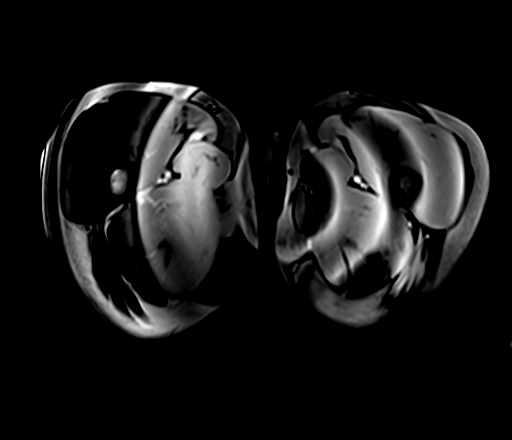

[Series 7: T1 · axial · 3.0mm · 0.31mm/px · 1 of 24 slices shown (2 of 2)]
[im 1/24]
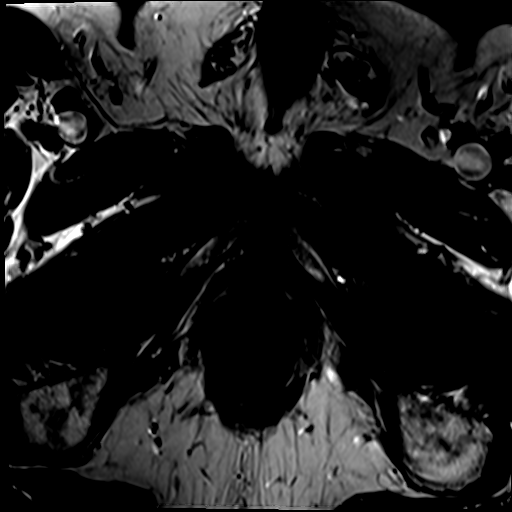

[Series 8: T2 · axial · 3.5mm · 0.56mm/px · 1 of 23 slices shown (3 of 4)]
[im 1/23]
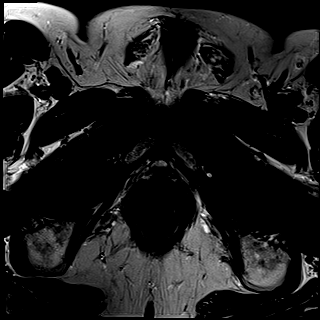

[Series 9: T2 · axial · 1.0mm · 1.04mm/px · 1 of 80 slices shown (4 of 4)]
[im 1/80]
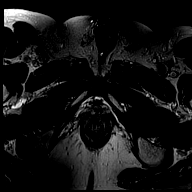

[Series 10: DWI · axial · 3.5mm · 1.56mm/px · 1 of 66 slices shown (1 of 2)]
[im 1/66]
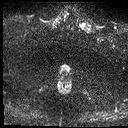

[Series 11: DWI · axial · 3.5mm · 1.56mm/px · 1 of 22 slices shown (2 of 2)]
[im 1/22]
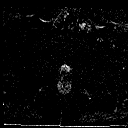

[Series 12: pre t1_twist_tra_dyn_ttc=5.8s · axial · non-contrast · 3.5mm · 0.83mm/px · 1 of 22 slices shown]
[im 1/22]
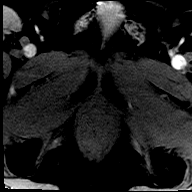

[Series 13: post t1_twist_tra_dyn-copy center · axial · 3.5mm · 0.83mm/px · 1 of 20 slices shown (1 of 24)]
[im 1/20]
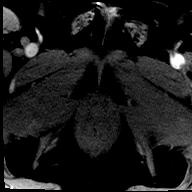

[Series 14: post t1_twist_tra_dyn-copy center · axial · 3.5mm · 0.83mm/px · 1 of 20 slices shown (2 of 24)]
[im 1/20]
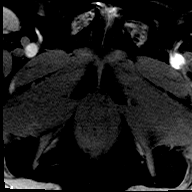

[Series 15: post t1_twist_tra_dyn-copy cent_sub_ttc=(id) · axial · 3.5mm · 0.83mm/px · 1 of 20 slices shown (1 of 22)]
[im 1/20]
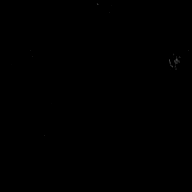

[Series 16: post t1_twist_tra_dyn-copy center · axial · 3.5mm · 0.83mm/px · 1 of 20 slices shown (3 of 24)]
[im 1/20]
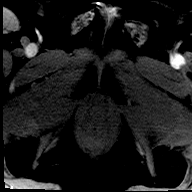

[Series 17: post t1_twist_tra_dyn-copy cent_sub_ttc=(id) · axial · 3.5mm · 0.83mm/px · 1 of 20 slices shown (2 of 22)]
[im 1/20]
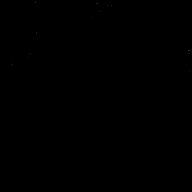

[Series 18: post t1_twist_tra_dyn-copy center · axial · 3.5mm · 0.83mm/px · 1 of 20 slices shown (4 of 24)]
[im 1/20]
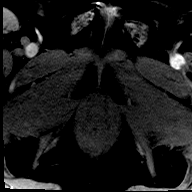

[Series 19: post t1_twist_tra_dyn-copy cent_sub_ttc=(id) · axial · 3.5mm · 0.83mm/px · 1 of 19 slices shown (3 of 22)]
[im 1/19]
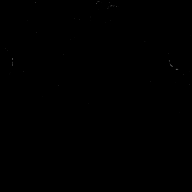

[Series 20: post t1_twist_tra_dyn-copy center · axial · 3.5mm · 0.83mm/px · 1 of 20 slices shown (5 of 24)]
[im 1/20]
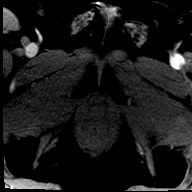

[Series 21: post t1_twist_tra_dyn-copy cent_sub_ttc=(id) · axial · 3.5mm · 0.83mm/px · 1 of 18 slices shown (4 of 22)]
[im 1/18]
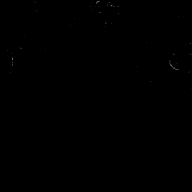

[Series 22: post t1_twist_tra_dyn-copy center · axial · 3.5mm · 0.83mm/px · 1 of 20 slices shown (6 of 24)]
[im 1/20]
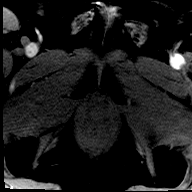

[Series 23: post t1_twist_tra_dyn-copy cent_sub_ttc=(id) · axial · 3.5mm · 0.83mm/px · 1 of 18 slices shown (5 of 22)]
[im 1/18]
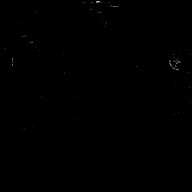

[Series 24: post t1_twist_tra_dyn-copy center · axial · 3.5mm · 0.83mm/px · 1 of 20 slices shown (7 of 24)]
[im 1/20]
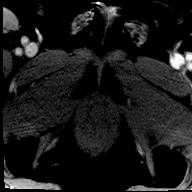

[Series 25: post t1_twist_tra_dyn-copy cent_sub_ttc=(id) · axial · 3.5mm · 0.83mm/px · 1 of 18 slices shown (6 of 22)]
[im 1/18]
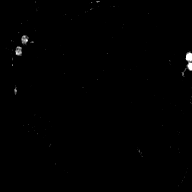

[Series 26: post t1_twist_tra_dyn-copy center · axial · 3.5mm · 0.83mm/px · 1 of 20 slices shown (8 of 24)]
[im 1/20]
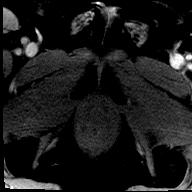

[Series 27: post t1_twist_tra_dyn-copy cent_sub_ttc=(id) · axial · 3.5mm · 0.83mm/px · 1 of 18 slices shown (7 of 22)]
[im 1/18]
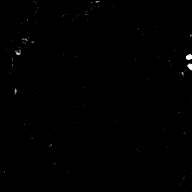

[Series 28: post t1_twist_tra_dyn-copy center · axial · 3.5mm · 0.83mm/px · 1 of 20 slices shown (9 of 24)]
[im 1/20]
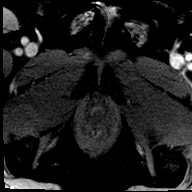

[Series 29: post t1_twist_tra_dyn-copy cent_sub_ttc=(id) · axial · 3.5mm · 0.83mm/px · 1 of 20 slices shown (8 of 22)]
[im 1/20]
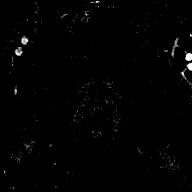

[Series 30: post t1_twist_tra_dyn-copy center · axial · 3.5mm · 0.83mm/px · 1 of 20 slices shown (10 of 24)]
[im 1/20]
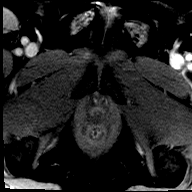

[Series 31: post t1_twist_tra_dyn-copy cent_sub_ttc=(id) · axial · 3.5mm · 0.83mm/px · 1 of 20 slices shown (9 of 22)]
[im 1/20]
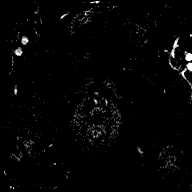

[Series 32: post t1_twist_tra_dyn-copy center · axial · 3.5mm · 0.83mm/px · 1 of 20 slices shown (11 of 24)]
[im 1/20]
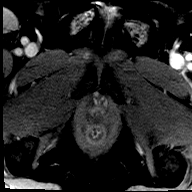

[Series 33: post t1_twist_tra_dyn-copy cent_sub_ttc=(id) · axial · 3.5mm · 0.83mm/px · 1 of 20 slices shown (10 of 22)]
[im 1/20]
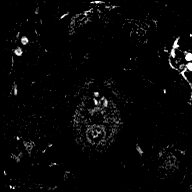

[Series 34: post t1_twist_tra_dyn-copy center · axial · 3.5mm · 0.83mm/px · 1 of 20 slices shown (12 of 24)]
[im 1/20]
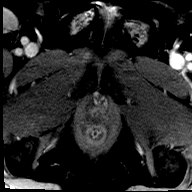

[Series 35: post t1_twist_tra_dyn-copy cent_sub_ttc=(id) · axial · 3.5mm · 0.83mm/px · 1 of 20 slices shown (11 of 22)]
[im 1/20]
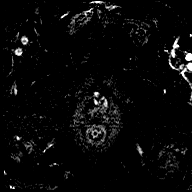

[Series 36: post t1_twist_tra_dyn-copy center · axial · 3.5mm · 0.83mm/px · 1 of 20 slices shown (13 of 24)]
[im 1/20]
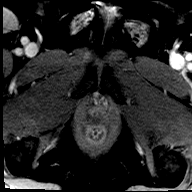

[Series 37: post t1_twist_tra_dyn-copy cent_sub_ttc=(id) · axial · 3.5mm · 0.83mm/px · 1 of 20 slices shown (12 of 22)]
[im 1/20]
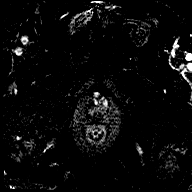

[Series 38: post t1_twist_tra_dyn-copy center · axial · 3.5mm · 0.83mm/px · 1 of 20 slices shown (14 of 24)]
[im 1/20]
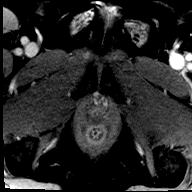

[Series 39: post t1_twist_tra_dyn-copy cent_sub_ttc=(id) · axial · 3.5mm · 0.83mm/px · 1 of 20 slices shown (13 of 22)]
[im 1/20]
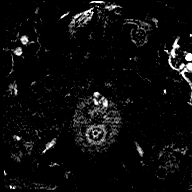

[Series 40: post t1_twist_tra_dyn-copy center · axial · 3.5mm · 0.83mm/px · 1 of 20 slices shown (15 of 24)]
[im 1/20]
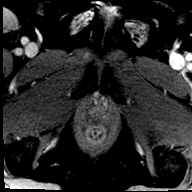

[Series 41: post t1_twist_tra_dyn-copy cent_sub_ttc=(id) · axial · 3.5mm · 0.83mm/px · 1 of 20 slices shown (14 of 22)]
[im 1/20]
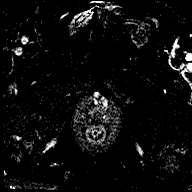

[Series 42: post t1_twist_tra_dyn-copy center · axial · 3.5mm · 0.83mm/px · 1 of 20 slices shown (16 of 24)]
[im 1/20]
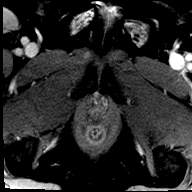

[Series 43: post t1_twist_tra_dyn-copy cent_sub_ttc=(id) · axial · 3.5mm · 0.83mm/px · 1 of 20 slices shown (15 of 22)]
[im 1/20]
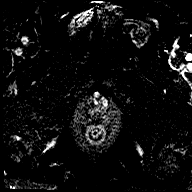

[Series 44: post t1_twist_tra_dyn-copy center · axial · 3.5mm · 0.83mm/px · 1 of 20 slices shown (17 of 24)]
[im 1/20]
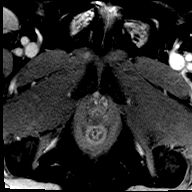

[Series 45: post t1_twist_tra_dyn-copy cent_sub_ttc=(id) · axial · 3.5mm · 0.83mm/px · 1 of 20 slices shown (16 of 22)]
[im 1/20]
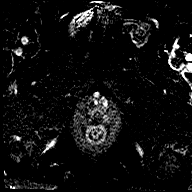

[Series 46: post t1_twist_tra_dyn-copy center · axial · 3.5mm · 0.83mm/px · 1 of 20 slices shown (18 of 24)]
[im 1/20]
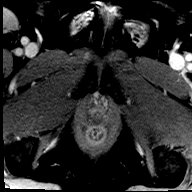

[Series 47: post t1_twist_tra_dyn-copy cent_sub_ttc=(id) · axial · 3.5mm · 0.83mm/px · 1 of 20 slices shown (17 of 22)]
[im 1/20]
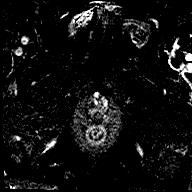

[Series 48: post t1_twist_tra_dyn-copy center · axial · 3.5mm · 0.83mm/px · 1 of 20 slices shown (19 of 24)]
[im 1/20]
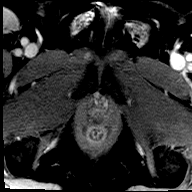

[Series 49: post t1_twist_tra_dyn-copy cent_sub_ttc=(id) · axial · 3.5mm · 0.83mm/px · 1 of 20 slices shown (18 of 22)]
[im 1/20]
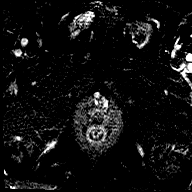

[Series 50: post t1_twist_tra_dyn-copy center · axial · 3.5mm · 0.83mm/px · 1 of 20 slices shown (20 of 24)]
[im 1/20]
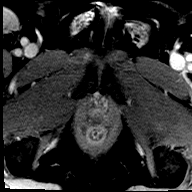

[Series 51: post t1_twist_tra_dyn-copy cent_sub_ttc=(id) · axial · 3.5mm · 0.83mm/px · 1 of 20 slices shown (19 of 22)]
[im 1/20]
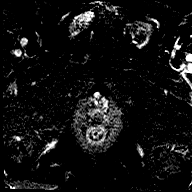

[Series 52: post t1_twist_tra_dyn-copy center · axial · 3.5mm · 0.83mm/px · 1 of 20 slices shown (21 of 24)]
[im 1/20]
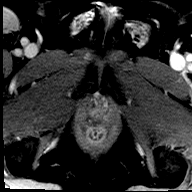

[Series 53: post t1_twist_tra_dyn-copy cent_sub_ttc=(id) · axial · 3.5mm · 0.83mm/px · 1 of 20 slices shown (20 of 22)]
[im 1/20]
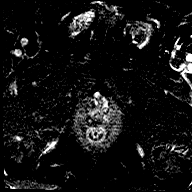

[Series 54: post t1_twist_tra_dyn-copy center · axial · 3.5mm · 0.83mm/px · 1 of 20 slices shown (22 of 24)]
[im 1/20]
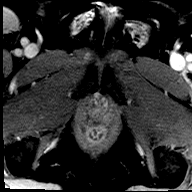

[Series 55: post t1_twist_tra_dyn-copy cent_sub_ttc=(id) · axial · 3.5mm · 0.83mm/px · 1 of 20 slices shown (21 of 22)]
[im 1/20]
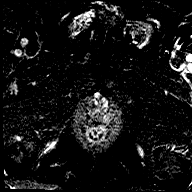

[Series 56: post t1_twist_tra_dyn-copy center · axial · 3.5mm · 0.83mm/px · 1 of 20 slices shown (23 of 24)]
[im 1/20]
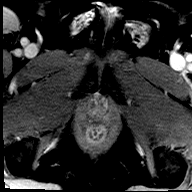

[Series 57: post t1_twist_tra_dyn-copy cent_sub_ttc=(id) · axial · 3.5mm · 0.83mm/px · 1 of 20 slices shown (22 of 22)]
[im 1/20]
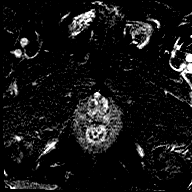

[Series 58: post t1_twist_tra_dyn-copy center · axial · 3.5mm · 0.83mm/px · 1 of 20 slices shown (24 of 24)]
[im 1/20]
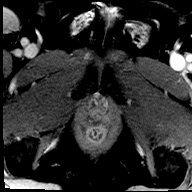

[56 of 56 positions shown; findings below may reference images not displayed]

FINDINGS: Prostate: No signal abnormality within the peripheral zone on T2
weighted imaging. No restricted diffusion within the peripheral
zone.

The transitional zone is enlarged with well capsulated enlarged
nodules. The prostatic capsule is intact. The neurovascular bundles
normal. Seminal vesicles are normal. There is asymmetry is Seminal
vesicles with the LEFT seminal vesicles smaller than the RIGHT.

Volume: 6.1 x 4.9 by 4.9 cm (volume = 77 cm^3)

Transcapsular spread:  Absent

Seminal vesicle involvement: Absent

Neurovascular bundle involvement: Absent

Pelvic adenopathy: Absent

Bone metastasis: Absent

Other findings: None
IMPRESSION: 1. No evidence of high-grade carcinoma in the peripheral zone
2. Enlarged nodular transitional zone most consistent with benign
prostate hypertrophy.
3. Prostatomegaly.

## 2016-11-08 MED ORDER — GADOBENATE DIMEGLUMINE 529 MG/ML IV SOLN
13.0000 mL | Freq: Once | INTRAVENOUS | Status: AC | PRN
  Administered 2016-11-08: 13 mL via INTRAVENOUS

## 2016-11-19 DIAGNOSIS — R351 Nocturia: Secondary | ICD-10-CM | POA: Diagnosis not present

## 2016-11-19 DIAGNOSIS — N401 Enlarged prostate with lower urinary tract symptoms: Secondary | ICD-10-CM | POA: Diagnosis not present

## 2016-11-19 DIAGNOSIS — C61 Malignant neoplasm of prostate: Secondary | ICD-10-CM | POA: Diagnosis not present

## 2017-05-18 DIAGNOSIS — C61 Malignant neoplasm of prostate: Secondary | ICD-10-CM | POA: Diagnosis not present

## 2017-05-25 DIAGNOSIS — R351 Nocturia: Secondary | ICD-10-CM | POA: Diagnosis not present

## 2017-05-25 DIAGNOSIS — C61 Malignant neoplasm of prostate: Secondary | ICD-10-CM | POA: Diagnosis not present

## 2017-05-25 DIAGNOSIS — N401 Enlarged prostate with lower urinary tract symptoms: Secondary | ICD-10-CM | POA: Diagnosis not present

## 2017-05-30 DIAGNOSIS — Z961 Presence of intraocular lens: Secondary | ICD-10-CM | POA: Diagnosis not present

## 2017-05-30 DIAGNOSIS — H18413 Arcus senilis, bilateral: Secondary | ICD-10-CM | POA: Diagnosis not present

## 2017-05-30 DIAGNOSIS — H524 Presbyopia: Secondary | ICD-10-CM | POA: Diagnosis not present

## 2017-05-30 DIAGNOSIS — H52223 Regular astigmatism, bilateral: Secondary | ICD-10-CM | POA: Diagnosis not present

## 2017-07-01 ENCOUNTER — Encounter: Payer: Self-pay | Admitting: Internal Medicine

## 2017-07-01 ENCOUNTER — Ambulatory Visit: Payer: PPO | Admitting: *Deleted

## 2017-07-01 ENCOUNTER — Other Ambulatory Visit (INDEPENDENT_AMBULATORY_CARE_PROVIDER_SITE_OTHER): Payer: PPO

## 2017-07-01 ENCOUNTER — Ambulatory Visit (INDEPENDENT_AMBULATORY_CARE_PROVIDER_SITE_OTHER): Payer: PPO | Admitting: Internal Medicine

## 2017-07-01 VITALS — BP 132/82 | HR 75 | Resp 18 | Ht 64.0 in | Wt 149.0 lb

## 2017-07-01 DIAGNOSIS — N320 Bladder-neck obstruction: Secondary | ICD-10-CM | POA: Diagnosis not present

## 2017-07-01 DIAGNOSIS — R972 Elevated prostate specific antigen [PSA]: Secondary | ICD-10-CM

## 2017-07-01 DIAGNOSIS — R000 Tachycardia, unspecified: Secondary | ICD-10-CM | POA: Diagnosis not present

## 2017-07-01 DIAGNOSIS — M25559 Pain in unspecified hip: Secondary | ICD-10-CM | POA: Diagnosis not present

## 2017-07-01 DIAGNOSIS — Z0000 Encounter for general adult medical examination without abnormal findings: Secondary | ICD-10-CM

## 2017-07-01 LAB — URINALYSIS
Bilirubin Urine: NEGATIVE
Hgb urine dipstick: NEGATIVE
Ketones, ur: NEGATIVE
Leukocytes, UA: NEGATIVE
NITRITE: NEGATIVE
PH: 7 (ref 5.0–8.0)
SPECIFIC GRAVITY, URINE: 1.01 (ref 1.000–1.030)
TOTAL PROTEIN, URINE-UPE24: NEGATIVE
URINE GLUCOSE: NEGATIVE
Urobilinogen, UA: 0.2 (ref 0.0–1.0)

## 2017-07-01 LAB — BASIC METABOLIC PANEL
BUN: 13 mg/dL (ref 6–23)
CHLORIDE: 101 meq/L (ref 96–112)
CO2: 31 meq/L (ref 19–32)
CREATININE: 1.15 mg/dL (ref 0.40–1.50)
Calcium: 8.9 mg/dL (ref 8.4–10.5)
GFR: 67.35 mL/min (ref >60.00)
Glucose, Bld: 110 mg/dL — ABNORMAL HIGH (ref 70–99)
Potassium: 4.7 mEq/L (ref 3.5–5.1)
Sodium: 138 mEq/L (ref 135–145)

## 2017-07-01 LAB — TSH: TSH: 2.37 u[IU]/mL (ref 0.35–4.50)

## 2017-07-01 LAB — CBC WITH DIFFERENTIAL/PLATELET
BASOS PCT: 0.5 % (ref 0.0–3.0)
Basophils Absolute: 0 10*3/uL (ref 0.0–0.1)
EOS PCT: 1.8 % (ref 0.0–5.0)
Eosinophils Absolute: 0.1 10*3/uL (ref 0.0–0.7)
HEMATOCRIT: 48 % (ref 39.0–52.0)
HEMOGLOBIN: 16.1 g/dL (ref 13.0–17.0)
LYMPHS PCT: 22.5 % (ref 12.0–46.0)
Lymphs Abs: 1.4 10*3/uL (ref 0.7–4.0)
MCHC: 33.6 g/dL (ref 30.0–36.0)
MCV: 84.7 fl (ref 78.0–100.0)
MONOS PCT: 11.1 % (ref 3.0–12.0)
Monocytes Absolute: 0.7 10*3/uL (ref 0.1–1.0)
Neutro Abs: 3.9 10*3/uL (ref 1.4–7.7)
Neutrophils Relative %: 64.1 % (ref 43.0–77.0)
Platelets: 198 10*3/uL (ref 150.0–400.0)
RBC: 5.66 Mil/uL (ref 4.22–5.81)
RDW: 15.9 % — AB (ref 11.5–15.5)
WBC: 6.1 10*3/uL (ref 4.0–10.5)

## 2017-07-01 LAB — HEPATIC FUNCTION PANEL
ALK PHOS: 78 U/L (ref 39–117)
ALT: 24 U/L (ref 0–53)
AST: 13 U/L (ref 0–37)
Albumin: 4 g/dL (ref 3.5–5.2)
BILIRUBIN DIRECT: 0.1 mg/dL (ref 0.0–0.3)
BILIRUBIN TOTAL: 1.1 mg/dL (ref 0.2–1.2)
Total Protein: 6.8 g/dL (ref 6.0–8.3)

## 2017-07-01 LAB — LIPID PANEL
Cholesterol: 212 mg/dL — ABNORMAL HIGH (ref 0–200)
HDL: 42.6 mg/dL (ref >39.00)
NONHDL: 169.58
Total CHOL/HDL Ratio: 5
Triglycerides: 267 mg/dL — ABNORMAL HIGH (ref 0.0–149.0)
VLDL: 53.4 mg/dL — ABNORMAL HIGH (ref 0.0–40.0)

## 2017-07-01 LAB — LDL CHOLESTEROL, DIRECT: Direct LDL: 146 mg/dL

## 2017-07-01 MED ORDER — VITAMIN D 50 MCG (2000 UT) PO CAPS: ORAL_CAPSULE | ORAL | 3 refills | Status: DC

## 2017-07-01 MED ORDER — VITAMIN B-12 1000 MCG PO TABS: 1000.0000 ug | ORAL_TABLET | Freq: Every day | ORAL | 3 refills | Status: DC

## 2017-07-01 MED ORDER — FINASTERIDE 5 MG PO TABS: 5.0000 mg | ORAL_TABLET | Freq: Every day | ORAL | 3 refills | Status: DC

## 2017-07-01 NOTE — Assessment & Plan Note (Addendum)
EKG Labs Card ref if needed

## 2017-07-01 NOTE — Patient Instructions (Signed)
Continue doing brain stimulating activities (puzzles, reading, adult coloring books, staying active) to keep memory sharp.   Continue to eat heart healthy diet (full of fruits, vegetables, whole grains, lean protein, water--limit salt, fat, and sugar intake) and increase physical activity as tolerated.   Antonio Yu , Thank you for taking time to come for your Medicare Wellness Visit. I appreciate your ongoing commitment to your health goals. Please review the following plan we discussed and let me know if I can assist you in the future.   These are the goals we discussed: Goals    . Patient Stated     Stay as healthy and as independent as possible.       This is a list of the screening recommended for you and due dates:  Health Maintenance  Topic Date Due  . Pneumonia vaccines (1 of 2 - PCV13) 07/02/2018*  . Flu Shot  09/22/2017  . Tetanus Vaccine  01/10/2022  . Colon Cancer Screening  05/22/2023  .  Hepatitis C: One time screening is recommended by Center for Disease Control  (CDC) for  adults born from [birth year] through 1965.   Completed  *Topic was postponed. The date shown is not the original due date.

## 2017-07-01 NOTE — Progress Notes (Addendum)
Subjective:   Antonio Yu is a 67 y.o. male who presents for Medicare Annual/Subsequent preventive examination.  Review of Systems:  No ROS.  Medicare Wellness Visit. Additional risk factors are reflected in the social history.  Cardiac Risk Factors include: advanced age (>69mn, >>84women);male gender Sleep patterns: feels rested on waking, gets up 2-3 times nightly to void and sleeps 7 hours nightly.    Home Safety/Smoke Alarms: Feels safe in home. Smoke alarms in place.  Living environment; residence and Firearm Safety: 1-story house/ trailer, no firearms., Lives with wife, no needs for DME, good support system Seat Belt Safety/Bike Helmet: Wears seat belt.     Objective:    Vitals: BP 132/82   Pulse 75   Resp 18   Ht 5' 4"  (1.626 m)   Wt 149 lb (67.6 kg)   SpO2 97%   BMI 25.58 kg/m   Body mass index is 25.58 kg/m.  Advanced Directives 07/01/2017  Does Patient Have a Medical Advance Directive? No  Would patient like information on creating a medical advance directive? No - Patient declined    Tobacco Social History   Tobacco Use  Smoking Status Never Smoker  Smokeless Tobacco Never Used     Counseling given: Not Answered  Past Medical History:  Diagnosis Date  . BPH (benign prostatic hyperplasia)   . Elevated PSA    Past Surgical History:  Procedure Laterality Date  . APPENDECTOMY    . HERNIA REPAIR Bilateral 2017   Family History  Problem Relation Age of Onset  . Cancer Mother 540 . Dementia Father 869 . Multiple sclerosis Brother    Social History   Socioeconomic History  . Marital status: Married    Spouse name: Not on file  . Number of children: 1  . Years of education: Not on file  . Highest education level: Not on file  Occupational History  . Not on file  Social Needs  . Financial resource strain: Not on file  . Food insecurity:    Worry: Never true    Inability: Never true  . Transportation needs:    Medical: No   Non-medical: No  Tobacco Use  . Smoking status: Never Smoker  . Smokeless tobacco: Never Used  Substance and Sexual Activity  . Alcohol use: No  . Drug use: No  . Sexual activity: Yes  Lifestyle  . Physical activity:    Days per week: 4 days    Minutes per session: 40 min  . Stress: Not at all  Relationships  . Social connections:    Talks on phone: More than three times a week    Gets together: More than three times a week    Attends religious service: 1 to 4 times per year    Active member of club or organization: No    Attends meetings of clubs or organizations: Never    Relationship status: Married  Other Topics Concern  . Not on file  Social History Narrative  . Not on file    Outpatient Encounter Medications as of 07/01/2017  Medication Sig  . Saw Palmetto 450 MG CAPS Take by mouth 2 (two) times daily.  . [DISCONTINUED] Cholecalciferol (VITAMIN D) 2000 units CAPS Take by mouth.  . [DISCONTINUED] vitamin B-12 (CYANOCOBALAMIN) 1000 MCG tablet Take 1,000 mcg by mouth daily.   No facility-administered encounter medications on file as of 07/01/2017.     Activities of Daily Living In your present state of health, do you  have any difficulty performing the following activities: 07/01/2017  Hearing? N  Vision? N  Difficulty concentrating or making decisions? N  Walking or climbing stairs? N  Dressing or bathing? N  Doing errands, shopping? N  Preparing Food and eating ? N  Using the Toilet? N  In the past six months, have you accidently leaked urine? N  Do you have problems with loss of bowel control? N  Managing your Medications? N  Managing your Finances? N  Housekeeping or managing your Housekeeping? N  Some recent data might be hidden    Patient Care Team: Plotnikov, Evie Lacks, MD as PCP - General (Internal Medicine)   Assessment:   This is a routine wellness examination for Antonio Yu. Physical assessment deferred to PCP.   Exercise Activities and Dietary  recommendations Current Exercise Habits: Home exercise routine, Type of exercise: walking;calisthenics;strength training/weights, Time (Minutes): 40, Frequency (Times/Week): 5, Weekly Exercise (Minutes/Week): 200, Exercise limited by: None identified  Diet (meal preparation, eat out, water intake, caffeinated beverages, dairy products, fruits and vegetables): in general, a "healthy" diet  , well balanced   Reviewed heart healthy diet, encouraged patient to increase daily water intake.     Goals    . Patient Stated     Stay as healthy and as independent as possible.      Depression Screen PHQ 2/9 Scores 07/01/2017 07/01/2017  PHQ - 2 Score 0 0  PHQ- 9 Score 0 -    Cognitive Function       Ad8 score reviewed for issues:  Issues making decisions: no  Less interest in hobbies / activities: no  Repeats questions, stories (family complaining): no  Trouble using ordinary gadgets (microwave, computer, phone):no  Forgets the month or year: no  Mismanaging finances: no  Remembering appts: no  Daily problems with thinking and/or memory: no Ad8 score is= 0  Immunization History  Administered Date(s) Administered  . MMR 05/11/2014  . Tdap 01/11/2012   Screening Tests Health Maintenance  Topic Date Due  . PNA vac Low Risk Adult (1 of 2 - PCV13) 07/02/2018 (Originally 03/06/2015)  . INFLUENZA VACCINE  09/22/2017  . TETANUS/TDAP  01/10/2022  . COLONOSCOPY  05/22/2023  . Hepatitis C Screening  Completed       Plan:    Continue doing brain stimulating activities (puzzles, reading, adult coloring books, staying active) to keep memory sharp.   Continue to eat heart healthy diet (full of fruits, vegetables, whole grains, lean protein, water--limit salt, fat, and sugar intake) and increase physical activity as tolerated.   I have personally reviewed and noted the following in the patient's chart:   . Medical and social history . Use of alcohol, tobacco or illicit drugs   . Current medications and supplements . Functional ability and status . Nutritional status . Physical activity . Advanced directives . List of other physicians . Vitals . Screenings to include cognitive, depression, and falls . Referrals and appointments  In addition, I have reviewed and discussed with patient certain preventive protocols, quality metrics, and best practice recommendations. A written personalized care plan for preventive services as well as general preventive health recommendations were provided to patient.     Michiel Cowboy, RN  07/01/2017  Medical screening examination/treatment/procedure(s) were performed by non-physician practitioner and as supervising physician I was immediately available for consultation/collaboration. I agree with above. Lew Dawes, MD

## 2017-07-01 NOTE — Progress Notes (Signed)
Subjective:  Patient ID: Antonio Yu, male    DOB: 1950/07/23  Age: 67 y.o. MRN: [id number]  CC: No chief complaint on file.   HPI Antonio Yu presents for a [facility] well exam w/Jill later F/u elevated PSA - Dr Alinda Money; urinary frequency C/o occ R SI joint area pain   Outpatient Medications Prior to Visit  Medication Sig Dispense Refill  . Cholecalciferol (VITAMIN D) 2000 units CAPS Take by mouth.    . Saw Palmetto 450 MG CAPS Take by mouth 2 (two) times daily.    . vitamin B-12 (CYANOCOBALAMIN) 1000 MCG tablet Take 1,000 mcg by mouth daily.     No facility-administered medications prior to visit.     ROS Review of Systems  Constitutional: Negative for appetite change, fatigue and unexpected weight change.  HENT: Negative for congestion, nosebleeds, sneezing, sore throat and trouble swallowing.   Eyes: Negative for itching and visual disturbance.  Respiratory: Negative for cough.   Cardiovascular: Positive for palpitations. Negative for chest pain and leg swelling.  Gastrointestinal: Negative for abdominal distention, blood in stool, diarrhea and nausea.  Genitourinary: Positive for difficulty urinating, frequency and urgency. Negative for hematuria.  Musculoskeletal: Negative for back pain, gait problem, joint swelling and neck pain.  Skin: Negative for rash.  Neurological: Negative for dizziness, tremors, speech difficulty and weakness.  Psychiatric/Behavioral: Negative for agitation, dysphoric mood and sleep disturbance. The patient is not nervous/anxious.     Objective:  BP 132/82 (BP Location: Left Arm, Patient Position: Sitting, Cuff Size: Normal)   Pulse 75   Temp 98.2 F (36.8 C) (Oral)   Ht 5' 6.26" (1.683 m)   Wt 149 lb (67.6 kg)   SpO2 97%   BMI 23.86 kg/m   BP Readings from Last 3 Encounters:  07/01/17 132/82  06/29/16 122/84    Wt Readings from Last 3 Encounters:  07/01/17 149 lb (67.6 kg)  06/29/16 144 lb 0.6 oz (65.3 kg)    Physical  Exam  Constitutional: He is oriented to person, place, and time. He appears well-developed. No distress.  NAD  HENT:  Mouth/Throat: Oropharynx is clear and moist.  Eyes: Pupils are equal, round, and reactive to light. Conjunctivae are normal.  Neck: Normal range of motion. No JVD present. No thyromegaly present.  Cardiovascular: Normal rate, regular rhythm, normal heart sounds and intact distal pulses. Exam reveals no gallop and no friction rub.  No murmur heard. Pulmonary/Chest: Effort normal and breath sounds normal. No respiratory distress. He has no wheezes. He has no rales. He exhibits no tenderness.  Abdominal: Soft. Bowel sounds are normal. He exhibits no distension and no mass. There is no tenderness. There is no rebound and no guarding.  Musculoskeletal: Normal range of motion. He exhibits no edema or tenderness.  Lymphadenopathy:    He has no cervical adenopathy.  Neurological: He is alert and oriented to person, place, and time. He has normal reflexes. No cranial nerve deficit. He exhibits normal muscle tone. He displays a negative Romberg sign. Coordination and gait normal.  Skin: Skin is warm and dry. No rash noted.  Psychiatric: He has a normal mood and affect. His behavior is normal. Judgment and thought content normal.   Rectal - per Dr Alinda Money q 6 mo  Procedure: EKG Indication: tachycardia Impression: NSR. No acute changes.   Lab Results  Component Value Date   WBC 5.5 06/29/2016   HGB 16.0 06/29/2016   HCT 48.5 06/29/2016   PLT 186.0 06/29/2016   GLUCOSE 103 (  H) 06/29/2016   CHOL 219 (H) 06/29/2016   TRIG 189.0 (H) 06/29/2016   HDL 51.70 06/29/2016   LDLCALC 129 (H) 06/29/2016   ALT 19 06/29/2016   AST 16 06/29/2016   NA 140 06/29/2016   K 4.0 06/29/2016   CL 102 06/29/2016   CREATININE 1.04 06/29/2016   BUN 11 06/29/2016   CO2 32 06/29/2016   TSH 1.57 06/29/2016   PSA 15.22 (H) 06/29/2016    Mr Prostate W Wo Contrast  Result Date:  11/08/2016 CLINICAL DATA:  Prostate carcinoma diagnosed 2016. Most recent PSA equal 15.2. EXAM: MR PROSTATE WITHOUT AND WITH CONTRAST TECHNIQUE: Multiplanar multisequence MRI images were obtained of the pelvis centered about the prostate. Pre and post contrast images were obtained. CONTRAST:  16mL MULTIHANCE GADOBENATE DIMEGLUMINE 529 MG/ML IV SOLN COMPARISON:  None available. 315 labs Bun 13 Creat. 1.26 FINDINGS: Prostate: No signal abnormality within the peripheral zone on T2 weighted imaging. No restricted diffusion within the peripheral zone. The transitional zone is enlarged with well capsulated enlarged nodules. The prostatic capsule is intact. The neurovascular bundles normal. Seminal vesicles are normal. There is asymmetry is Seminal vesicles with the LEFT seminal vesicles smaller than the RIGHT. Volume: 6.1 x 4.9 by 4.9 cm (volume = 77 cm^3) Transcapsular spread:  Absent Seminal vesicle involvement: Absent Neurovascular bundle involvement: Absent Pelvic adenopathy: Absent Bone metastasis: Absent Other findings: None IMPRESSION: 1. No evidence of high-grade carcinoma in the peripheral zone 2. Enlarged nodular transitional zone most consistent with benign prostate hypertrophy. 3. Prostatomegaly. Electronically Signed   By: Suzy Bouchard M.D.   On: 11/08/2016 14:14    Assessment & Plan:   There are no diagnoses linked to this encounter. I am having Antonio Yu maintain his Vitamin D, vitamin B-12, and Saw Palmetto.  No orders of the defined types were placed in this encounter.    Follow-up: No follow-ups on file.  Walker Kehr, MD

## 2017-07-01 NOTE — Assessment & Plan Note (Signed)
Dr Alinda Money q 6 mo S/p MRI, prostate bx x3 in [place] 5/19

## 2017-07-01 NOTE — Assessment & Plan Note (Addendum)
Start Finesteride F/u w/Dr Alinda Money q 6 mo

## 2017-07-01 NOTE — Assessment & Plan Note (Signed)
Sports med ref if relapsed

## 2017-12-23 DIAGNOSIS — C61 Malignant neoplasm of prostate: Secondary | ICD-10-CM | POA: Diagnosis not present

## 2017-12-30 ENCOUNTER — Other Ambulatory Visit: Payer: Self-pay | Admitting: Urology

## 2017-12-30 DIAGNOSIS — R351 Nocturia: Secondary | ICD-10-CM | POA: Diagnosis not present

## 2017-12-30 DIAGNOSIS — N401 Enlarged prostate with lower urinary tract symptoms: Secondary | ICD-10-CM | POA: Diagnosis not present

## 2017-12-30 DIAGNOSIS — C61 Malignant neoplasm of prostate: Secondary | ICD-10-CM

## 2018-01-31 ENCOUNTER — Ambulatory Visit
Admission: RE | Admit: 2018-01-31 | Discharge: 2018-01-31 | Disposition: A | Discharge status: Home / Self Care | Payer: PPO | Source: Ambulatory Visit | Attending: Urology | Admitting: Urology

## 2018-01-31 DIAGNOSIS — C61 Malignant neoplasm of prostate: Secondary | ICD-10-CM

## 2018-01-31 DIAGNOSIS — R972 Elevated prostate specific antigen [PSA]: Secondary | ICD-10-CM | POA: Diagnosis not present

## 2018-01-31 MED ORDER — GADOBENATE DIMEGLUMINE 529 MG/ML IV SOLN
14.0000 mL | Freq: Once | INTRAVENOUS | Status: AC | PRN
  Administered 2018-01-31: 14 mL via INTRAVENOUS

## 2018-06-26 ENCOUNTER — Encounter: Payer: PPO | Admitting: Internal Medicine

## 2018-07-03 ENCOUNTER — Encounter: Payer: PPO | Admitting: Internal Medicine

## 2018-07-03 ENCOUNTER — Ambulatory Visit: Payer: PPO

## 2018-07-05 ENCOUNTER — Telehealth: Payer: Self-pay | Admitting: Internal Medicine

## 2018-07-05 NOTE — Telephone Encounter (Signed)
Copied from [place]. Topic: Quick Communication - Rx Refill/Question >> Jul 05, 2018 12:47 PM Reyne Dumas L wrote: Medication: Finasceride  Has the patient contacted their pharmacy? Yes - unable to refill (Agent: If no, request that the patient contact the pharmacy for the refill.) (Agent: If yes, when and what did the pharmacy advise?)  Preferred Pharmacy (with phone number or street name): [place], [place] - [address] [phone] (Phone) [phone] (Fax)  Agent: Please be advised that RX refills may take up to 3 business days. We ask that you follow-up with your pharmacy.

## 2018-07-05 NOTE — Telephone Encounter (Signed)
Phone visit scheduled for Monday 5/18.

## 2018-07-05 NOTE — Telephone Encounter (Signed)
Pt has not been seen in a year, please schedule pt OV or VOV and I will send in enough to get to appointment

## 2018-07-06 MED ORDER — FINASTERIDE 5 MG PO TABS: 5.0000 mg | ORAL_TABLET | Freq: Every day | ORAL | 0 refills | Status: DC

## 2018-07-06 NOTE — Telephone Encounter (Signed)
30 day RX sent 

## 2018-07-10 ENCOUNTER — Ambulatory Visit (INDEPENDENT_AMBULATORY_CARE_PROVIDER_SITE_OTHER): Payer: PPO | Admitting: Internal Medicine

## 2018-07-10 ENCOUNTER — Encounter: Payer: Self-pay | Admitting: Internal Medicine

## 2018-07-10 DIAGNOSIS — R000 Tachycardia, unspecified: Secondary | ICD-10-CM | POA: Diagnosis not present

## 2018-07-10 DIAGNOSIS — Z0000 Encounter for general adult medical examination without abnormal findings: Secondary | ICD-10-CM

## 2018-07-10 DIAGNOSIS — E559 Vitamin D deficiency, unspecified: Secondary | ICD-10-CM | POA: Diagnosis not present

## 2018-07-10 DIAGNOSIS — R972 Elevated prostate specific antigen [PSA]: Secondary | ICD-10-CM | POA: Diagnosis not present

## 2018-07-10 MED ORDER — FINASTERIDE 5 MG PO TABS: 5.0000 mg | ORAL_TABLET | Freq: Every day | ORAL | 3 refills | Status: DC

## 2018-07-10 NOTE — Assessment & Plan Note (Signed)
Mild.  Heart rate is 82 per patient w/blood pressure is 130/80

## 2018-07-10 NOTE — Assessment & Plan Note (Signed)
Follow-up with Dr. Alinda Money.  Repeat prostate biopsy scheduled for August 09, 2018.  PSA went down on finasteride.  It was 4.9 in December per patient

## 2018-07-10 NOTE — Progress Notes (Addendum)
Virtual Visit via Telephone Note  I connected with Roverto Bodmer on 07/10/18 at  1:40 PM EDT by telephone and verified that I am speaking with the correct person using two identifiers.   I discussed the limitations, risks, security and privacy concerns of performing an evaluation and management service by telephone and the availability of in person appointments. I also discussed with the patient that there may be a patient responsible charge related to this service. The patient expressed understanding and agreed to proceed.   History of Present Illness: Follow-up on 9 elevated PSA, vitamin D deficiency, tachycardia No chest pain, abdominal pain, shortness of breath, leg swelling, syncope, headache.   Observations/Objective:  The patient sounds good on the phone.  She tells me that she has blood pressures 130/80, heart rate 82, O2 sat 94% on room air Assessment and Plan:  See plan Follow Up Instructions:    I discussed the assessment and treatment plan with the patient. The patient was provided an opportunity to ask questions and all were answered. The patient agreed with the plan and demonstrated an understanding of the instructions.   The patient was advised to call back or seek an in-person evaluation if the symptoms worsen or if the condition fails to improve as anticipated.  I provided 21 minutes of non-face-to-face time during this encounter.   Walker Kehr, MD

## 2018-07-10 NOTE — Assessment & Plan Note (Signed)
Continue with vitamin D Check levels

## 2018-07-25 DIAGNOSIS — C61 Malignant neoplasm of prostate: Secondary | ICD-10-CM | POA: Diagnosis not present

## 2018-08-01 DIAGNOSIS — C61 Malignant neoplasm of prostate: Secondary | ICD-10-CM | POA: Diagnosis not present

## 2018-08-01 DIAGNOSIS — R351 Nocturia: Secondary | ICD-10-CM | POA: Diagnosis not present

## 2018-08-01 DIAGNOSIS — N401 Enlarged prostate with lower urinary tract symptoms: Secondary | ICD-10-CM | POA: Diagnosis not present

## 2018-08-09 DIAGNOSIS — C61 Malignant neoplasm of prostate: Secondary | ICD-10-CM | POA: Diagnosis not present

## 2018-09-22 ENCOUNTER — Other Ambulatory Visit: Payer: Self-pay

## 2018-09-22 DIAGNOSIS — Z20822 Contact with and (suspected) exposure to covid-19: Secondary | ICD-10-CM

## 2018-09-22 DIAGNOSIS — R6889 Other general symptoms and signs: Secondary | ICD-10-CM | POA: Diagnosis not present

## 2018-09-24 LAB — NOVEL CORONAVIRUS, NAA: SARS-CoV-2, NAA: NOT DETECTED

## 2018-11-16 ENCOUNTER — Other Ambulatory Visit: Payer: Self-pay

## 2018-11-16 ENCOUNTER — Other Ambulatory Visit (INDEPENDENT_AMBULATORY_CARE_PROVIDER_SITE_OTHER): Payer: PPO

## 2018-11-16 ENCOUNTER — Ambulatory Visit (INDEPENDENT_AMBULATORY_CARE_PROVIDER_SITE_OTHER): Payer: PPO

## 2018-11-16 DIAGNOSIS — R000 Tachycardia, unspecified: Secondary | ICD-10-CM | POA: Diagnosis not present

## 2018-11-16 DIAGNOSIS — Z125 Encounter for screening for malignant neoplasm of prostate: Secondary | ICD-10-CM

## 2018-11-16 DIAGNOSIS — E559 Vitamin D deficiency, unspecified: Secondary | ICD-10-CM

## 2018-11-16 DIAGNOSIS — Z0000 Encounter for general adult medical examination without abnormal findings: Secondary | ICD-10-CM | POA: Diagnosis not present

## 2018-11-16 DIAGNOSIS — Z23 Encounter for immunization: Secondary | ICD-10-CM

## 2018-11-16 DIAGNOSIS — R972 Elevated prostate specific antigen [PSA]: Secondary | ICD-10-CM

## 2018-11-16 DIAGNOSIS — Z299 Encounter for prophylactic measures, unspecified: Secondary | ICD-10-CM

## 2018-11-16 LAB — URINALYSIS
Bilirubin Urine: NEGATIVE
Hgb urine dipstick: NEGATIVE
Ketones, ur: NEGATIVE
Leukocytes,Ua: NEGATIVE
Nitrite: NEGATIVE
Specific Gravity, Urine: 1.015 (ref 1.000–1.030)
Total Protein, Urine: NEGATIVE
Urine Glucose: NEGATIVE
Urobilinogen, UA: 0.2 (ref 0.0–1.0)
pH: 7 (ref 5.0–8.0)

## 2018-11-16 LAB — LIPID PANEL
Cholesterol: 223 mg/dL — ABNORMAL HIGH (ref 0–200)
HDL: 44.2 mg/dL (ref >39.00)
NonHDL: 178.78
Total CHOL/HDL Ratio: 5
Triglycerides: 217 mg/dL — ABNORMAL HIGH (ref 0.0–149.0)
VLDL: 43.4 mg/dL — ABNORMAL HIGH (ref 0.0–40.0)

## 2018-11-16 LAB — HEPATIC FUNCTION PANEL
ALT: 21 U/L (ref 0–53)
AST: 17 U/L (ref 0–37)
Albumin: 4.2 g/dL (ref 3.5–5.2)
Alkaline Phosphatase: 89 U/L (ref 39–117)
Bilirubin, Direct: 0.1 mg/dL (ref 0.0–0.3)
Total Bilirubin: 1 mg/dL (ref 0.2–1.2)
Total Protein: 6.4 g/dL (ref 6.0–8.3)

## 2018-11-16 LAB — CBC WITH DIFFERENTIAL/PLATELET
Basophils Absolute: 0 10*3/uL (ref 0.0–0.1)
Basophils Relative: 1 % (ref 0.0–3.0)
Eosinophils Absolute: 0.1 10*3/uL (ref 0.0–0.7)
Eosinophils Relative: 1.4 % (ref 0.0–5.0)
HCT: 47.2 % (ref 39.0–52.0)
Hemoglobin: 15.5 g/dL (ref 13.0–17.0)
Lymphocytes Relative: 32 % (ref 12.0–46.0)
Lymphs Abs: 1.5 10*3/uL (ref 0.7–4.0)
MCHC: 32.9 g/dL (ref 30.0–36.0)
MCV: 85.5 fl (ref 78.0–100.0)
Monocytes Absolute: 0.4 10*3/uL (ref 0.1–1.0)
Monocytes Relative: 8.9 % (ref 3.0–12.0)
Neutro Abs: 2.6 10*3/uL (ref 1.4–7.7)
Neutrophils Relative %: 56.7 % (ref 43.0–77.0)
Platelets: 155 10*3/uL (ref 150.0–400.0)
RBC: 5.52 Mil/uL (ref 4.22–5.81)
RDW: 14.9 % (ref 11.5–15.5)
WBC: 4.7 10*3/uL (ref 4.0–10.5)

## 2018-11-16 LAB — PSA: PSA: 6.6 ng/mL — ABNORMAL HIGH (ref 0.10–4.00)

## 2018-11-16 LAB — LDL CHOLESTEROL, DIRECT: Direct LDL: 150 mg/dL

## 2018-11-16 LAB — BASIC METABOLIC PANEL
BUN: 15 mg/dL (ref 6–23)
CO2: 30 mEq/L (ref 19–32)
Calcium: 9.2 mg/dL (ref 8.4–10.5)
Chloride: 103 mEq/L (ref 96–112)
Creatinine, Ser: 1.1 mg/dL (ref 0.40–1.50)
GFR: 66.43 mL/min (ref >60.00)
Glucose, Bld: 118 mg/dL — ABNORMAL HIGH (ref 70–99)
Potassium: 4.3 mEq/L (ref 3.5–5.1)
Sodium: 140 mEq/L (ref 135–145)

## 2018-11-16 LAB — TSH: TSH: 3.68 u[IU]/mL (ref 0.35–4.50)

## 2018-11-16 LAB — VITAMIN D 25 HYDROXY (VIT D DEFICIENCY, FRACTURES): VITD: 31.27 ng/mL (ref 30.00–100.00)

## 2019-01-17 ENCOUNTER — Other Ambulatory Visit: Payer: Self-pay

## 2019-01-26 DIAGNOSIS — C61 Malignant neoplasm of prostate: Secondary | ICD-10-CM | POA: Diagnosis not present

## 2019-02-09 DIAGNOSIS — N401 Enlarged prostate with lower urinary tract symptoms: Secondary | ICD-10-CM | POA: Diagnosis not present

## 2019-02-09 DIAGNOSIS — R351 Nocturia: Secondary | ICD-10-CM | POA: Diagnosis not present

## 2019-02-09 DIAGNOSIS — H40013 Open angle with borderline findings, low risk, bilateral: Secondary | ICD-10-CM | POA: Diagnosis not present

## 2019-02-09 DIAGNOSIS — C61 Malignant neoplasm of prostate: Secondary | ICD-10-CM | POA: Diagnosis not present

## 2019-02-13 ENCOUNTER — Other Ambulatory Visit: Payer: PPO

## 2019-05-22 ENCOUNTER — Telehealth: Payer: Self-pay | Admitting: Internal Medicine

## 2019-05-22 DIAGNOSIS — Z0000 Encounter for general adult medical examination without abnormal findings: Secondary | ICD-10-CM

## 2019-05-22 DIAGNOSIS — E785 Hyperlipidemia, unspecified: Secondary | ICD-10-CM

## 2019-05-22 DIAGNOSIS — R972 Elevated prostate specific antigen [PSA]: Secondary | ICD-10-CM

## 2019-05-22 NOTE — Telephone Encounter (Signed)
   Request order for annual labs

## 2019-05-30 NOTE — Telephone Encounter (Signed)
CPX is scheduled for 07/30/19. Pls advise if ok for cpx labs prior to cpx.Marland KitchenJohny Yu

## 2019-05-31 NOTE — Telephone Encounter (Signed)
Called pt spoke w/wife inform MD ok labs. Made lab appt for 07/26/19.Antonio KitchenJohny Yu

## 2019-05-31 NOTE — Telephone Encounter (Signed)
Ok Thx 

## 2019-07-26 ENCOUNTER — Other Ambulatory Visit (INDEPENDENT_AMBULATORY_CARE_PROVIDER_SITE_OTHER): Payer: PPO

## 2019-07-26 ENCOUNTER — Other Ambulatory Visit: Payer: Self-pay

## 2019-07-26 DIAGNOSIS — Z0000 Encounter for general adult medical examination without abnormal findings: Secondary | ICD-10-CM | POA: Diagnosis not present

## 2019-07-26 DIAGNOSIS — E785 Hyperlipidemia, unspecified: Secondary | ICD-10-CM | POA: Diagnosis not present

## 2019-07-26 DIAGNOSIS — H40013 Open angle with borderline findings, low risk, bilateral: Secondary | ICD-10-CM | POA: Diagnosis not present

## 2019-07-26 DIAGNOSIS — R972 Elevated prostate specific antigen [PSA]: Secondary | ICD-10-CM

## 2019-07-26 LAB — CBC WITH DIFFERENTIAL/PLATELET
Basophils Absolute: 0.1 10*3/uL (ref 0.0–0.1)
Basophils Relative: 1.2 % (ref 0.0–3.0)
Eosinophils Absolute: 0.1 10*3/uL (ref 0.0–0.7)
Eosinophils Relative: 2.4 % (ref 0.0–5.0)
HCT: 47.2 % (ref 39.0–52.0)
Hemoglobin: 15.7 g/dL (ref 13.0–17.0)
Lymphocytes Relative: 38.1 % (ref 12.0–46.0)
Lymphs Abs: 1.9 10*3/uL (ref 0.7–4.0)
MCHC: 33.3 g/dL (ref 30.0–36.0)
MCV: 86.3 fl (ref 78.0–100.0)
Monocytes Absolute: 0.4 10*3/uL (ref 0.1–1.0)
Monocytes Relative: 7.8 % (ref 3.0–12.0)
Neutro Abs: 2.5 10*3/uL (ref 1.4–7.7)
Neutrophils Relative %: 50.5 % (ref 43.0–77.0)
Platelets: 157 10*3/uL (ref 150.0–400.0)
RBC: 5.47 Mil/uL (ref 4.22–5.81)
RDW: 15.6 % — ABNORMAL HIGH (ref 11.5–15.5)
WBC: 4.9 10*3/uL (ref 4.0–10.5)

## 2019-07-26 LAB — HEPATIC FUNCTION PANEL
ALT: 16 U/L (ref 0–53)
AST: 14 U/L (ref 0–37)
Albumin: 4.3 g/dL (ref 3.5–5.2)
Alkaline Phosphatase: 79 U/L (ref 39–117)
Bilirubin, Direct: 0.1 mg/dL (ref 0.0–0.3)
Total Bilirubin: 1 mg/dL (ref 0.2–1.2)
Total Protein: 6.4 g/dL (ref 6.0–8.3)

## 2019-07-26 LAB — URINALYSIS
Bilirubin Urine: NEGATIVE
Ketones, ur: NEGATIVE
Leukocytes,Ua: NEGATIVE
Nitrite: NEGATIVE
Specific Gravity, Urine: 1.02 (ref 1.000–1.030)
Total Protein, Urine: NEGATIVE
Urine Glucose: NEGATIVE
Urobilinogen, UA: 0.2 (ref 0.0–1.0)
pH: 6.5 (ref 5.0–8.0)

## 2019-07-26 LAB — BASIC METABOLIC PANEL
BUN: 13 mg/dL (ref 6–23)
CO2: 32 mEq/L (ref 19–32)
Calcium: 9.1 mg/dL (ref 8.4–10.5)
Chloride: 101 mEq/L (ref 96–112)
Creatinine, Ser: 1.15 mg/dL (ref 0.40–1.50)
GFR: 62.98 mL/min (ref >60.00)
Glucose, Bld: 106 mg/dL — ABNORMAL HIGH (ref 70–99)
Potassium: 4 mEq/L (ref 3.5–5.1)
Sodium: 140 mEq/L (ref 135–145)

## 2019-07-26 LAB — LIPID PANEL
Cholesterol: 253 mg/dL — ABNORMAL HIGH (ref 0–200)
HDL: 50.2 mg/dL (ref >39.00)
LDL Cholesterol: 167 mg/dL — ABNORMAL HIGH (ref 0–99)
NonHDL: 202.73
Total CHOL/HDL Ratio: 5
Triglycerides: 181 mg/dL — ABNORMAL HIGH (ref 0.0–149.0)
VLDL: 36.2 mg/dL (ref 0.0–40.0)

## 2019-07-26 LAB — TSH: TSH: 4.13 u[IU]/mL (ref 0.35–4.50)

## 2019-07-26 LAB — PSA: PSA: 6.53 ng/mL — ABNORMAL HIGH (ref 0.10–4.00)

## 2019-07-30 ENCOUNTER — Encounter: Payer: Self-pay | Admitting: Internal Medicine

## 2019-07-30 ENCOUNTER — Other Ambulatory Visit: Payer: Self-pay

## 2019-07-30 ENCOUNTER — Ambulatory Visit (INDEPENDENT_AMBULATORY_CARE_PROVIDER_SITE_OTHER): Payer: PPO | Admitting: Internal Medicine

## 2019-07-30 VITALS — BP 114/80 | HR 105 | Temp 98.4°F | Ht 64.0 in | Wt 144.0 lb

## 2019-07-30 DIAGNOSIS — N529 Male erectile dysfunction, unspecified: Secondary | ICD-10-CM | POA: Diagnosis not present

## 2019-07-30 DIAGNOSIS — Z0000 Encounter for general adult medical examination without abnormal findings: Secondary | ICD-10-CM | POA: Diagnosis not present

## 2019-07-30 DIAGNOSIS — N320 Bladder-neck obstruction: Secondary | ICD-10-CM

## 2019-07-30 DIAGNOSIS — E559 Vitamin D deficiency, unspecified: Secondary | ICD-10-CM

## 2019-07-30 DIAGNOSIS — R972 Elevated prostate specific antigen [PSA]: Secondary | ICD-10-CM | POA: Diagnosis not present

## 2019-07-30 MED ORDER — SILDENAFIL CITRATE 100 MG PO TABS
100.0000 mg | ORAL_TABLET | Freq: Every day | ORAL | 5 refills | Status: DC | PRN
Start: 2019-07-30 — End: 2019-12-25

## 2019-07-30 NOTE — Assessment & Plan Note (Addendum)
Vit D Risks associated with treatment noncompliance were discussed. Compliance was encouraged.

## 2019-07-30 NOTE — Assessment & Plan Note (Signed)
Appt w/Dr Alinda Money in 1 month

## 2019-07-30 NOTE — Assessment & Plan Note (Addendum)
New Viagra prn

## 2019-07-30 NOTE — Patient Instructions (Signed)

## 2019-07-30 NOTE — Assessment & Plan Note (Addendum)
  We discussed age appropriate health related issues, including available/recomended screening tests and vaccinations. Labs were ordered to be later reviewed . All questions were answered. We discussed one or more of the following - seat belt use, use of sunscreen/sun exposure exercise, safe sex, fall risk reduction, second hand smoke exposure, firearm use and storage, seat belt use, a need for adhering to healthy diet and exercise. Labs were discussed. All questions were answered. A  cardiac CT scan for calcium scoring test offered Colon 2015 due in 2025

## 2019-07-30 NOTE — Assessment & Plan Note (Signed)
Finesteride 

## 2019-07-30 NOTE — Progress Notes (Signed)
Subjective:  Patient ID: Antonio Yu, male    DOB: 07/14/1950  Age: 69 y.o. MRN: [id number]  CC: No chief complaint on file.   HPI Antonio Yu presents for well exam  C/o ED F/u elevated PSA   Outpatient Medications Prior to Visit  Medication Sig Dispense Refill  . Cholecalciferol (VITAMIN D) 2000 units CAPS 1 po qd 100 capsule 3  . finasteride (PROSCAR) 5 MG tablet Take 1 tablet (5 mg total) by mouth daily. 90 tablet 3  . vitamin B-12 (CYANOCOBALAMIN) 1000 MCG tablet Take 1 tablet (1,000 mcg total) by mouth daily. 100 tablet 3   No facility-administered medications prior to visit.    ROS: Review of Systems  Constitutional: Negative for appetite change, fatigue and unexpected weight change.  HENT: Negative for congestion, nosebleeds, sneezing, sore throat and trouble swallowing.   Eyes: Negative for itching and visual disturbance.  Respiratory: Negative for cough.   Cardiovascular: Negative for chest pain, palpitations and leg swelling.  Gastrointestinal: Negative for abdominal distention, blood in stool, diarrhea and nausea.  Genitourinary: Positive for frequency and urgency. Negative for hematuria.  Musculoskeletal: Negative for back pain, gait problem, joint swelling and neck pain.  Skin: Negative for rash.  Neurological: Negative for dizziness, tremors, speech difficulty and weakness.  Psychiatric/Behavioral: Negative for agitation, dysphoric mood and sleep disturbance. The patient is not nervous/anxious.     Objective:  BP 114/80 (BP Location: Left Arm, Patient Position: Sitting, Cuff Size: Normal)   Pulse (!) 105   Temp 98.4 F (36.9 C) (Oral)   Ht 5\' 4"  (1.626 m)   Wt 144 lb (65.3 kg)   SpO2 95%   BMI 24.72 kg/m   BP Readings from Last 3 Encounters:  07/30/19 114/80  07/01/17 132/82  07/01/17 132/82    Wt Readings from Last 3 Encounters:  07/30/19 144 lb (65.3 kg)  07/01/17 149 lb (67.6 kg)  07/01/17 149 lb (67.6 kg)    Physical  Exam Constitutional:      General: He is not in acute distress.    Appearance: He is well-developed.     Comments: NAD  Eyes:     Conjunctiva/sclera: Conjunctivae normal.     Pupils: Pupils are equal, round, and reactive to light.  Neck:     Thyroid: No thyromegaly.     Vascular: No JVD.  Cardiovascular:     Rate and Rhythm: Normal rate and regular rhythm.     Heart sounds: Normal heart sounds. No murmur. No friction rub. No gallop.   Pulmonary:     Effort: Pulmonary effort is normal. No respiratory distress.     Breath sounds: Normal breath sounds. No wheezing or rales.  Chest:     Chest wall: No tenderness.  Abdominal:     General: Bowel sounds are normal. There is no distension.     Palpations: Abdomen is soft. There is no mass.     Tenderness: There is no abdominal tenderness. There is no guarding or rebound.  Musculoskeletal:        General: No tenderness. Normal range of motion.     Cervical back: Normal range of motion.  Lymphadenopathy:     Cervical: No cervical adenopathy.  Skin:    General: Skin is warm and dry.     Findings: No rash.  Neurological:     Mental Status: He is alert and oriented to person, place, and time.     Cranial Nerves: No cranial nerve deficit.     Motor:  No abnormal muscle tone.     Coordination: Coordination normal.     Gait: Gait normal.     Deep Tendon Reflexes: Reflexes are normal and symmetric.  Psychiatric:        Behavior: Behavior normal.        Thought Content: Thought content normal.        Judgment: Judgment normal.    Rectal - per Urology   Lab Results  Component Value Date   WBC 4.9 07/26/2019   HGB 15.7 07/26/2019   HCT 47.2 07/26/2019   PLT 157.0 07/26/2019   GLUCOSE 106 (H) 07/26/2019   CHOL 253 (H) 07/26/2019   TRIG 181.0 (H) 07/26/2019   HDL 50.20 07/26/2019   LDLDIRECT 150.0 11/16/2018   LDLCALC 167 (H) 07/26/2019   ALT 16 07/26/2019   AST 14 07/26/2019   NA 140 07/26/2019   K 4.0 07/26/2019   CL 101  07/26/2019   CREATININE 1.15 07/26/2019   BUN 13 07/26/2019   CO2 32 07/26/2019   TSH 4.13 07/26/2019   PSA 6.53 (H) 07/26/2019    MR PROSTATE W WO CONTRAST  Result Date: 01/31/2018 CLINICAL DATA:  Prostate cancer, Gleason 3+3=6 adenocarcinoma the left lateral base. Continued elevation in PSA level. EXAM: MR PROSTATE WITHOUT AND WITH CONTRAST TECHNIQUE: Multiplanar multisequence MRI images were obtained of the pelvis centered about the prostate. Pre and post contrast images were obtained. CONTRAST:  41mL MULTIHANCE GADOBENATE DIMEGLUMINE 529 MG/ML IV SOLN Creatinine was obtained on site at [facility] at [address] Results: Creatinine 1.2 mg/dL. COMPARISON:  Prostate MRI from 11/08/2016 FINDINGS: Prostate: Overall prominent transition zone/central gland with various nodules most of which are T2 hyperintense. The several T2 hypointense nodules are relatively well-defined and not associated with significantly activity on high B value diffusion-weighted images. A small PI-RADS category 3 lesion in the right posterolateral peripheral zone apex has a volume of 0.1 cubic cm and measures 0.7 by 0.2 by 0.5 cm. No appreciable abnormal early enhancement or abnormal activity on diffusion-weighted images. There is some linear bandlike PI-RADS category 2 appearance of the left prostate apex for example on images 15 through 17 of series 7, without other worrisome characteristics. Volume: 3D volumetric analysis: 75.14 cubic cm (6.0 by 5.1 by 5.3 cm). Transcapsular spread:  Absent Seminal vesicle involvement: Absent Neurovascular bundle involvement: Absent Pelvic adenopathy: Absent Bone metastasis: Absent Other findings: No supplemental non-categorized findings. IMPRESSION: 1. Small PI-RADS category 3 lesion in the right posterolateral apex peripheral zone. There is some minimal PI-RADS category 2 bandlike signal in the left prostate apex. Appropriate targeting data for the PI-RADS category 3 lesion was  sent to [facility]. 2. Prostate volume: 75.14 cubic cm. 3. Benign prostatic hypertrophy. Electronically Signed   By: Van Clines M.D.   On: 01/31/2018 14:46    Assessment & Plan:    Walker Kehr, MD

## 2019-08-02 DIAGNOSIS — H15101 Unspecified episcleritis, right eye: Secondary | ICD-10-CM | POA: Diagnosis not present

## 2019-08-15 DIAGNOSIS — C61 Malignant neoplasm of prostate: Secondary | ICD-10-CM | POA: Diagnosis not present

## 2019-08-17 ENCOUNTER — Telehealth: Payer: Self-pay

## 2019-08-17 ENCOUNTER — Other Ambulatory Visit: Payer: Self-pay

## 2019-08-17 MED ORDER — FINASTERIDE 5 MG PO TABS: 5.0000 mg | ORAL_TABLET | Freq: Every day | ORAL | 3 refills | Status: DC

## 2019-08-17 NOTE — Telephone Encounter (Signed)
Telephone # has been updated.Marland KitchenJohny Yu

## 2019-08-17 NOTE — Telephone Encounter (Signed)
I spoke with Dow Adolph and informed her that the medication was filled to the pharmacy requested

## 2019-08-17 NOTE — Telephone Encounter (Signed)
1.Medication Requested:finasteride (PROSCAR) 5 MG tablet  2. Pharmacy (Name, Street, [place]):[place], [place] - [address]  3. On Med List: Yes   4. Last Visit with PCP: 6.7.2021  5. Next visit date with PCP: n/a   Agent: Please be advised that RX refills may take up to 3 business days. We ask that you follow-up with your pharmacy.

## 2019-08-22 DIAGNOSIS — R351 Nocturia: Secondary | ICD-10-CM | POA: Diagnosis not present

## 2019-08-22 DIAGNOSIS — C61 Malignant neoplasm of prostate: Secondary | ICD-10-CM | POA: Diagnosis not present

## 2019-08-22 DIAGNOSIS — R3121 Asymptomatic microscopic hematuria: Secondary | ICD-10-CM | POA: Diagnosis not present

## 2019-08-22 DIAGNOSIS — N401 Enlarged prostate with lower urinary tract symptoms: Secondary | ICD-10-CM | POA: Diagnosis not present

## 2019-12-19 ENCOUNTER — Telehealth: Payer: Self-pay | Admitting: Internal Medicine

## 2019-12-19 NOTE — Telephone Encounter (Signed)
LVM for pt to rtn mt call to schedule AWV with [facility]. If pt calls the office please schedule AWV with [facility]

## 2019-12-25 ENCOUNTER — Other Ambulatory Visit: Payer: Self-pay

## 2019-12-25 ENCOUNTER — Ambulatory Visit (INDEPENDENT_AMBULATORY_CARE_PROVIDER_SITE_OTHER): Payer: PPO | Admitting: Internal Medicine

## 2019-12-25 ENCOUNTER — Encounter: Payer: Self-pay | Admitting: Internal Medicine

## 2019-12-25 VITALS — BP 130/82 | HR 71 | Temp 98.4°F | Wt 146.8 lb

## 2019-12-25 DIAGNOSIS — L290 Pruritus ani: Secondary | ICD-10-CM | POA: Insufficient documentation

## 2019-12-25 DIAGNOSIS — E785 Hyperlipidemia, unspecified: Secondary | ICD-10-CM | POA: Diagnosis not present

## 2019-12-25 DIAGNOSIS — N529 Male erectile dysfunction, unspecified: Secondary | ICD-10-CM | POA: Diagnosis not present

## 2019-12-25 DIAGNOSIS — Z23 Encounter for immunization: Secondary | ICD-10-CM | POA: Diagnosis not present

## 2019-12-25 DIAGNOSIS — R972 Elevated prostate specific antigen [PSA]: Secondary | ICD-10-CM

## 2019-12-25 DIAGNOSIS — Z0000 Encounter for general adult medical examination without abnormal findings: Secondary | ICD-10-CM | POA: Diagnosis not present

## 2019-12-25 DIAGNOSIS — E559 Vitamin D deficiency, unspecified: Secondary | ICD-10-CM

## 2019-12-25 LAB — CBC WITH DIFFERENTIAL/PLATELET
Basophils Absolute: 0.1 10*3/uL (ref 0.0–0.1)
Basophils Relative: 1.7 % (ref 0.0–3.0)
Eosinophils Absolute: 0.1 10*3/uL (ref 0.0–0.7)
Eosinophils Relative: 1.9 % (ref 0.0–5.0)
HCT: 46.4 % (ref 39.0–52.0)
Hemoglobin: 15.5 g/dL (ref 13.0–17.0)
Lymphocytes Relative: 26.6 % (ref 12.0–46.0)
Lymphs Abs: 1.4 10*3/uL (ref 0.7–4.0)
MCHC: 33.5 g/dL (ref 30.0–36.0)
MCV: 85 fl (ref 78.0–100.0)
Monocytes Absolute: 0.5 10*3/uL (ref 0.1–1.0)
Monocytes Relative: 9.3 % (ref 3.0–12.0)
Neutro Abs: 3.1 10*3/uL (ref 1.4–7.7)
Neutrophils Relative %: 60.5 % (ref 43.0–77.0)
Platelets: 176 10*3/uL (ref 150.0–400.0)
RBC: 5.46 Mil/uL (ref 4.22–5.81)
RDW: 15.1 % (ref 11.5–15.5)
WBC: 5.1 10*3/uL (ref 4.0–10.5)

## 2019-12-25 LAB — URINALYSIS, ROUTINE W REFLEX MICROSCOPIC
Bilirubin Urine: NEGATIVE
Ketones, ur: NEGATIVE
Leukocytes,Ua: NEGATIVE
Nitrite: NEGATIVE
Specific Gravity, Urine: 1.015 (ref 1.000–1.030)
Total Protein, Urine: NEGATIVE
Urine Glucose: NEGATIVE
Urobilinogen, UA: 0.2 (ref 0.0–1.0)
WBC, UA: NONE SEEN (ref 0–?)
pH: 5.5 (ref 5.0–8.0)

## 2019-12-25 LAB — COMPREHENSIVE METABOLIC PANEL
ALT: 14 U/L (ref 0–53)
AST: 14 U/L (ref 0–37)
Albumin: 4.1 g/dL (ref 3.5–5.2)
Alkaline Phosphatase: 84 U/L (ref 39–117)
BUN: 12 mg/dL (ref 6–23)
CO2: 28 mEq/L (ref 19–32)
Calcium: 9.1 mg/dL (ref 8.4–10.5)
Chloride: 102 mEq/L (ref 96–112)
Creatinine, Ser: 1.11 mg/dL (ref 0.40–1.50)
GFR: 67.61 mL/min (ref >60.00)
Glucose, Bld: 98 mg/dL (ref 70–99)
Potassium: 4 mEq/L (ref 3.5–5.1)
Sodium: 138 mEq/L (ref 135–145)
Total Bilirubin: 0.9 mg/dL (ref 0.2–1.2)
Total Protein: 6.5 g/dL (ref 6.0–8.3)

## 2019-12-25 LAB — PSA: PSA: 10.2 ng/mL — ABNORMAL HIGH (ref 0.10–4.00)

## 2019-12-25 LAB — LDL CHOLESTEROL, DIRECT: Direct LDL: 153 mg/dL

## 2019-12-25 LAB — LIPID PANEL
Cholesterol: 230 mg/dL — ABNORMAL HIGH (ref 0–200)
HDL: 48.3 mg/dL (ref >39.00)
NonHDL: 181.85
Total CHOL/HDL Ratio: 5
Triglycerides: 230 mg/dL — ABNORMAL HIGH (ref 0.0–149.0)
VLDL: 46 mg/dL — ABNORMAL HIGH (ref 0.0–40.0)

## 2019-12-25 LAB — TSH: TSH: 2.92 u[IU]/mL (ref 0.35–4.50)

## 2019-12-25 MED ORDER — TRIAMCINOLONE ACETONIDE 0.1 % EX OINT
1.0000 | TOPICAL_OINTMENT | Freq: Two times a day (BID) | CUTANEOUS | 2 refills | Status: AC | PRN
Start: 2019-12-25 — End: ?

## 2019-12-25 NOTE — Assessment & Plan Note (Addendum)
Per Dr Alinda Money Last PSA 7

## 2019-12-25 NOTE — Assessment & Plan Note (Signed)
D/c Viagra

## 2019-12-25 NOTE — Assessment & Plan Note (Signed)
Moisture wicking underwear - briefs Triamc oint

## 2019-12-25 NOTE — Patient Instructions (Signed)
Moisture wicking underwear - briefs

## 2019-12-25 NOTE — Assessment & Plan Note (Signed)
Vit D 

## 2019-12-25 NOTE — Progress Notes (Addendum)
Subjective:  Patient ID: Gian Ybarra, male    DOB: [birth year]  Age: 69 y.o. MRN: [id number]  CC: Follow-up (3-4 month F/U- Prevnar)   HPI   C/o anal irritation x weeks F/u on ED, elevated PSA  Outpatient Medications Prior to Visit  Medication Sig Dispense Refill  . Cholecalciferol (VITAMIN D) 2000 units CAPS 1 po qd 100 capsule 3  . finasteride (PROSCAR) 5 MG tablet Take 1 tablet (5 mg total) by mouth daily. 90 tablet 3  . vitamin B-12 (CYANOCOBALAMIN) 1000 MCG tablet Take 1 tablet (1,000 mcg total) by mouth daily. 100 tablet 3  . sildenafil (VIAGRA) 100 MG tablet Take 1 tablet (100 mg total) by mouth daily as needed for erectile dysfunction. (Patient not taking: Reported on 12/25/2019) 12 tablet 5   No facility-administered medications prior to visit.    ROS: Review of Systems  Constitutional: Negative for appetite change, fatigue and unexpected weight change.  HENT: Negative for congestion, nosebleeds, sneezing, sore throat and trouble swallowing.   Eyes: Negative for itching and visual disturbance.  Respiratory: Negative for cough.   Cardiovascular: Negative for chest pain, palpitations and leg swelling.  Gastrointestinal: Negative for abdominal distention, blood in stool, diarrhea and nausea.  Genitourinary: Negative for frequency and hematuria.  Musculoskeletal: Negative for back pain, gait problem, joint swelling and neck pain.  Skin: Positive for rash.  Neurological: Negative for dizziness, tremors, speech difficulty and weakness.  Psychiatric/Behavioral: Negative for agitation, dysphoric mood and sleep disturbance. The patient is not nervous/anxious.     Objective:  BP 130/82 (BP Location: Left Arm)   Pulse 71   Temp 98.4 F (36.9 C) (Oral)   Wt 146 lb 12.8 oz (66.6 kg)   SpO2 96%   BMI 25.20 kg/m   BP Readings from Last 3 Encounters:  12/25/19 130/82  07/30/19 114/80  07/01/17 132/82    Wt Readings from Last 3 Encounters:  12/25/19 146 lb 12.8  oz (66.6 kg)  07/30/19 144 lb (65.3 kg)  07/01/17 149 lb (67.6 kg)    Physical Exam Constitutional:      General: He is not in acute distress.    Appearance: He is well-developed.     Comments: NAD  Eyes:     Conjunctiva/sclera: Conjunctivae normal.     Pupils: Pupils are equal, round, and reactive to light.  Neck:     Thyroid: No thyromegaly.     Vascular: No JVD.  Cardiovascular:     Rate and Rhythm: Normal rate and regular rhythm.     Heart sounds: Normal heart sounds. No murmur heard.  No friction rub. No gallop.   Pulmonary:     Effort: Pulmonary effort is normal. No respiratory distress.     Breath sounds: Normal breath sounds. No wheezing or rales.  Chest:     Chest wall: No tenderness.  Abdominal:     General: Bowel sounds are normal. There is no distension.     Palpations: Abdomen is soft. There is no mass.     Tenderness: There is no abdominal tenderness. There is no guarding or rebound.  Musculoskeletal:        General: No tenderness. Normal range of motion.     Cervical back: Normal range of motion.  Lymphadenopathy:     Cervical: No cervical adenopathy.  Skin:    General: Skin is warm and dry.     Findings: Rash present.  Neurological:     Mental Status: He is alert and oriented to person,  place, and time.     Cranial Nerves: No cranial nerve deficit.     Motor: No abnormal muscle tone.     Coordination: Coordination normal.     Gait: Gait normal.     Deep Tendon Reflexes: Reflexes are normal and symmetric.  Psychiatric:        Behavior: Behavior normal.        Thought Content: Thought content normal.        Judgment: Judgment normal.    Anal skin irritation Rectal - per Urology   Lab Results  Component Value Date   WBC 4.9 07/26/2019   HGB 15.7 07/26/2019   HCT 47.2 07/26/2019   PLT 157.0 07/26/2019   GLUCOSE 106 (H) 07/26/2019   CHOL 253 (H) 07/26/2019   TRIG 181.0 (H) 07/26/2019   HDL 50.20 07/26/2019   LDLDIRECT 150.0 11/16/2018    LDLCALC 167 (H) 07/26/2019   ALT 16 07/26/2019   AST 14 07/26/2019   NA 140 07/26/2019   K 4.0 07/26/2019   CL 101 07/26/2019   CREATININE 1.15 07/26/2019   BUN 13 07/26/2019   CO2 32 07/26/2019   TSH 4.13 07/26/2019   PSA 6.53 (H) 07/26/2019    MR PROSTATE W WO CONTRAST  Result Date: 01/31/2018 CLINICAL DATA:  Prostate cancer, Gleason 3+3=6 adenocarcinoma the left lateral base. Continued elevation in PSA level. EXAM: MR PROSTATE WITHOUT AND WITH CONTRAST TECHNIQUE: Multiplanar multisequence MRI images were obtained of the pelvis centered about the prostate. Pre and post contrast images were obtained. CONTRAST:  34mL MULTIHANCE GADOBENATE DIMEGLUMINE 529 MG/ML IV SOLN Creatinine was obtained on site at [facility] at [address] Results: Creatinine 1.2 mg/dL. COMPARISON:  Prostate MRI from 11/08/2016 FINDINGS: Prostate: Overall prominent transition zone/central gland with various nodules most of which are T2 hyperintense. The several T2 hypointense nodules are relatively well-defined and not associated with significantly activity on high B value diffusion-weighted images. A small PI-RADS category 3 lesion in the right posterolateral peripheral zone apex has a volume of 0.1 cubic cm and measures 0.7 by 0.2 by 0.5 cm. No appreciable abnormal early enhancement or abnormal activity on diffusion-weighted images. There is some linear bandlike PI-RADS category 2 appearance of the left prostate apex for example on images 15 through 17 of series 7, without other worrisome characteristics. Volume: 3D volumetric analysis: 75.14 cubic cm (6.0 by 5.1 by 5.3 cm). Transcapsular spread:  Absent Seminal vesicle involvement: Absent Neurovascular bundle involvement: Absent Pelvic adenopathy: Absent Bone metastasis: Absent Other findings: No supplemental non-categorized findings. IMPRESSION: 1. Small PI-RADS category 3 lesion in the right posterolateral apex peripheral zone. There is some minimal PI-RADS  category 2 bandlike signal in the left prostate apex. Appropriate targeting data for the PI-RADS category 3 lesion was sent to [facility]. 2. Prostate volume: 75.14 cubic cm. 3. Benign prostatic hypertrophy. Electronically Signed   By: Van Clines M.D.   On: 01/31/2018 14:46    Assessment & Plan:     Follow-up: No follow-ups on file.  Walker Kehr, MD

## 2019-12-25 NOTE — Assessment & Plan Note (Deleted)
  We discussed age appropriate health related issues, including available/recomended screening tests and vaccinations. Labs were ordered to be later reviewed . All questions were answered. We discussed one or more of the following - seat belt use, use of sunscreen/sun exposure exercise, safe sex, fall risk reduction, second hand smoke exposure, firearm use and storage, seat belt use, a need for adhering to healthy diet and exercise. Labs were discussed. All questions were answered. A  cardiac CT scan for calcium scoring test offered - declined Pt refused a flu shot Colon 2015 due in 2025 Hep C(-)

## 2020-02-13 NOTE — Addendum Note (Signed)
Addended by: Cassandria Anger on: 02/13/2020 01:46 PM   Modules accepted: Level of Service

## 2020-03-14 DIAGNOSIS — C61 Malignant neoplasm of prostate: Secondary | ICD-10-CM | POA: Diagnosis not present

## 2020-03-21 DIAGNOSIS — N401 Enlarged prostate with lower urinary tract symptoms: Secondary | ICD-10-CM | POA: Diagnosis not present

## 2020-03-21 DIAGNOSIS — C61 Malignant neoplasm of prostate: Secondary | ICD-10-CM | POA: Diagnosis not present

## 2020-03-21 DIAGNOSIS — R351 Nocturia: Secondary | ICD-10-CM | POA: Diagnosis not present

## 2020-08-22 MED ORDER — FINASTERIDE 5 MG PO TABS
5.0000 mg | ORAL_TABLET | Freq: Every day | ORAL | 0 refills | Status: DC
Start: 2020-08-22 — End: 2020-09-15

## 2020-09-15 ENCOUNTER — Ambulatory Visit (INDEPENDENT_AMBULATORY_CARE_PROVIDER_SITE_OTHER): Payer: PPO | Admitting: Internal Medicine

## 2020-09-15 ENCOUNTER — Other Ambulatory Visit: Payer: Self-pay

## 2020-09-15 ENCOUNTER — Encounter: Payer: Self-pay | Admitting: Internal Medicine

## 2020-09-15 DIAGNOSIS — Z0000 Encounter for general adult medical examination without abnormal findings: Secondary | ICD-10-CM | POA: Diagnosis not present

## 2020-09-15 DIAGNOSIS — E559 Vitamin D deficiency, unspecified: Secondary | ICD-10-CM

## 2020-09-15 DIAGNOSIS — R972 Elevated prostate specific antigen [PSA]: Secondary | ICD-10-CM | POA: Diagnosis not present

## 2020-09-15 DIAGNOSIS — N320 Bladder-neck obstruction: Secondary | ICD-10-CM

## 2020-09-15 DIAGNOSIS — E785 Hyperlipidemia, unspecified: Secondary | ICD-10-CM

## 2020-09-15 LAB — COMPREHENSIVE METABOLIC PANEL
ALT: 12 U/L (ref 0–53)
AST: 12 U/L (ref 0–37)
Albumin: 4.2 g/dL (ref 3.5–5.2)
Alkaline Phosphatase: 81 U/L (ref 39–117)
BUN: 11 mg/dL (ref 6–23)
CO2: 29 mEq/L (ref 19–32)
Calcium: 9.1 mg/dL (ref 8.4–10.5)
Chloride: 103 mEq/L (ref 96–112)
Creatinine, Ser: 1.06 mg/dL (ref 0.40–1.50)
GFR: 71.09 mL/min (ref >60.00)
Glucose, Bld: 97 mg/dL (ref 70–99)
Potassium: 4.3 mEq/L (ref 3.5–5.1)
Sodium: 140 mEq/L (ref 135–145)
Total Bilirubin: 1.2 mg/dL (ref 0.2–1.2)
Total Protein: 6.5 g/dL (ref 6.0–8.3)

## 2020-09-15 LAB — CBC WITH DIFFERENTIAL/PLATELET
Basophils Absolute: 0.1 10*3/uL (ref 0.0–0.1)
Basophils Relative: 1.2 % (ref 0.0–3.0)
Eosinophils Absolute: 0 10*3/uL (ref 0.0–0.7)
Eosinophils Relative: 0.6 % (ref 0.0–5.0)
HCT: 48.4 % (ref 39.0–52.0)
Hemoglobin: 16.1 g/dL (ref 13.0–17.0)
Lymphocytes Relative: 20.3 % (ref 12.0–46.0)
Lymphs Abs: 0.9 10*3/uL (ref 0.7–4.0)
MCHC: 33.2 g/dL (ref 30.0–36.0)
MCV: 86.8 fl (ref 78.0–100.0)
Monocytes Absolute: 0.4 10*3/uL (ref 0.1–1.0)
Monocytes Relative: 8.6 % (ref 3.0–12.0)
Neutro Abs: 3.1 10*3/uL (ref 1.4–7.7)
Neutrophils Relative %: 69.3 % (ref 43.0–77.0)
Platelets: 150 10*3/uL (ref 150.0–400.0)
RBC: 5.58 Mil/uL (ref 4.22–5.81)
RDW: 14.8 % (ref 11.5–15.5)
WBC: 4.4 10*3/uL (ref 4.0–10.5)

## 2020-09-15 LAB — TSH: TSH: 2.39 u[IU]/mL (ref 0.35–5.50)

## 2020-09-15 LAB — URINALYSIS
Bilirubin Urine: NEGATIVE
Ketones, ur: NEGATIVE
Leukocytes,Ua: NEGATIVE
Nitrite: NEGATIVE
Specific Gravity, Urine: <=1.005 — AB (ref 1.000–1.030)
Total Protein, Urine: NEGATIVE
Urine Glucose: NEGATIVE
Urobilinogen, UA: 0.2 (ref 0.0–1.0)
pH: 6 (ref 5.0–8.0)

## 2020-09-15 LAB — LIPID PANEL
Cholesterol: 252 mg/dL — ABNORMAL HIGH (ref 0–200)
HDL: 49.1 mg/dL (ref >39.00)
LDL Cholesterol: 165 mg/dL — ABNORMAL HIGH (ref 0–99)
NonHDL: 203.11
Total CHOL/HDL Ratio: 5
Triglycerides: 193 mg/dL — ABNORMAL HIGH (ref 0.0–149.0)
VLDL: 38.6 mg/dL (ref 0.0–40.0)

## 2020-09-15 LAB — PSA: PSA: 7.98 ng/mL — ABNORMAL HIGH (ref 0.10–4.00)

## 2020-09-15 MED ORDER — VITAMIN D 50 MCG (2000 UT) PO CAPS: ORAL_CAPSULE | ORAL | 3 refills | Status: AC

## 2020-09-15 MED ORDER — VITAMIN B-12 1000 MCG PO TABS: 1000.0000 ug | ORAL_TABLET | Freq: Every day | ORAL | 3 refills | Status: AC

## 2020-09-15 MED ORDER — FINASTERIDE 5 MG PO TABS
5.0000 mg | ORAL_TABLET | Freq: Every day | ORAL | 3 refills | Status: DC
Start: 2020-09-15 — End: 2021-10-08

## 2020-09-15 NOTE — Assessment & Plan Note (Addendum)
Lipids Coronary calcium CT offered Pt declined statins

## 2020-09-15 NOTE — Progress Notes (Signed)
Subjective:  Patient ID: Antonio Yu, male    DOB: November 18, 1950  Age: 70 y.o. MRN: [id number]  CC: Annual Exam   HPI Antonio Yu presents for a well exam. F/u elevated PSA, Vit D and B 12 def  Outpatient Medications Prior to Visit  Medication Sig Dispense Refill   Cholecalciferol (VITAMIN D) 2000 units CAPS 1 po qd 100 capsule 3   triamcinolone ointment (KENALOG) 0.1 % Apply 1 application topically 2 (two) times daily as needed. 80 g 2   vitamin B-12 (CYANOCOBALAMIN) 1000 MCG tablet Take 1 tablet (1,000 mcg total) by mouth daily. 100 tablet 3   finasteride (PROSCAR) 5 MG tablet Take 1 tablet (5 mg total) by mouth daily. 30 tablet 0   No facility-administered medications prior to visit.    ROS: Review of Systems  Constitutional:  Negative for appetite change, fatigue and unexpected weight change.  HENT:  Negative for congestion, nosebleeds, sneezing, sore throat and trouble swallowing.   Eyes:  Negative for itching and visual disturbance.  Respiratory:  Negative for cough.   Cardiovascular:  Negative for chest pain, palpitations and leg swelling.  Gastrointestinal:  Negative for abdominal distention, blood in stool, diarrhea and nausea.  Genitourinary:  Negative for frequency and hematuria.  Musculoskeletal:  Negative for back pain, gait problem, joint swelling and neck pain.  Skin:  Negative for rash.  Neurological:  Negative for dizziness, tremors, speech difficulty and weakness.  Psychiatric/Behavioral:  Negative for agitation, dysphoric mood and sleep disturbance. The patient is not nervous/anxious.    Objective:  There were no vitals taken for this visit.  BP Readings from Last 3 Encounters:  12/25/19 130/82  07/30/19 114/80  07/01/17 132/82    Wt Readings from Last 3 Encounters:  12/25/19 146 lb 12.8 oz (66.6 kg)  07/30/19 144 lb (65.3 kg)  07/01/17 149 lb (67.6 kg)    Physical Exam Constitutional:      General: He is not in acute distress.     Appearance: He is well-developed.     Comments: NAD  Eyes:     Conjunctiva/sclera: Conjunctivae normal.     Pupils: Pupils are equal, round, and reactive to light.  Neck:     Thyroid: No thyromegaly.     Vascular: No JVD.  Cardiovascular:     Rate and Rhythm: Normal rate and regular rhythm.     Heart sounds: Normal heart sounds. No murmur heard.   No friction rub. No gallop.  Pulmonary:     Effort: Pulmonary effort is normal. No respiratory distress.     Breath sounds: Normal breath sounds. No wheezing or rales.  Chest:     Chest wall: No tenderness.  Abdominal:     General: Bowel sounds are normal. There is no distension.     Palpations: Abdomen is soft. There is no mass.     Tenderness: There is no abdominal tenderness. There is no guarding or rebound.  Musculoskeletal:        General: No tenderness. Normal range of motion.     Cervical back: Normal range of motion.  Lymphadenopathy:     Cervical: No cervical adenopathy.  Skin:    General: Skin is warm and dry.     Findings: No rash.  Neurological:     Mental Status: He is alert and oriented to person, place, and time.     Cranial Nerves: No cranial nerve deficit.     Motor: No abnormal muscle tone.     Coordination: Coordination  normal.     Gait: Gait normal.     Deep Tendon Reflexes: Reflexes are normal and symmetric.  Psychiatric:        Behavior: Behavior normal.        Thought Content: Thought content normal.        Judgment: Judgment normal.    Lab Results  Component Value Date   WBC 5.1 12/25/2019   HGB 15.5 12/25/2019   HCT 46.4 12/25/2019   PLT 176.0 12/25/2019   GLUCOSE 98 12/25/2019   CHOL 230 (H) 12/25/2019   TRIG 230.0 (H) 12/25/2019   HDL 48.30 12/25/2019   LDLDIRECT 153.0 12/25/2019   LDLCALC 167 (H) 07/26/2019   ALT 14 12/25/2019   AST 14 12/25/2019   NA 138 12/25/2019   K 4.0 12/25/2019   CL 102 12/25/2019   CREATININE 1.11 12/25/2019   BUN 12 12/25/2019   CO2 28 12/25/2019   TSH 2.92  12/25/2019   PSA 10.20 (H) 12/25/2019    MR PROSTATE W WO CONTRAST  Result Date: 01/31/2018 CLINICAL DATA:  Prostate cancer, Gleason 3+3=6 adenocarcinoma the left lateral base. Continued elevation in PSA level. EXAM: MR PROSTATE WITHOUT AND WITH CONTRAST TECHNIQUE: Multiplanar multisequence MRI images were obtained of the pelvis centered about the prostate. Pre and post contrast images were obtained. CONTRAST:  42m MULTIHANCE GADOBENATE DIMEGLUMINE 529 MG/ML IV SOLN [facility] [address] Results: Creatinine 1.2 mg/dL. COMPARISON:  Prostate MRI from 11/08/2016 FINDINGS: Prostate: Overall prominent transition zone/central gland with various nodules most of which are T2 hyperintense. The several T2 hypointense nodules are relatively well-defined and not associated with significantly activity on high B value diffusion-weighted images. A small PI-RADS category 3 lesion in the right posterolateral peripheral zone apex has a volume of 0.1 cubic cm and measures 0.7 by 0.2 by 0.5 cm. No appreciable abnormal early enhancement or abnormal activity on diffusion-weighted images. There is some linear bandlike PI-RADS category 2 appearance of the left prostate apex for example on images 15 through 17 of series 7, without other worrisome characteristics. Volume: 3D volumetric analysis: 75.14 cubic cm (6.0 by 5.1 by 5.3 cm). Transcapsular spread:  Absent Seminal vesicle involvement: Absent Neurovascular bundle involvement: Absent Pelvic adenopathy: Absent Bone metastasis: Absent Other findings: No supplemental non-categorized findings. IMPRESSION: 1. Small PI-RADS category 3 lesion in the right posterolateral apex peripheral zone. There is some minimal PI-RADS category 2 bandlike signal in the left prostate apex. Appropriate targeting data for the PI-RADS category 3 lesion [place] to [facility] 2. Prostate volume: 75.14 cubic cm. 3. Benign prostatic hypertrophy.  Electronically Signed   By: WVan ClinesM.D.   On: 01/31/2018 14:46    Assessment & Plan:   There are no diagnoses linked to this encounter.   Meds ordered this encounter  Medications   finasteride (PROSCAR) 5 MG tablet    Sig: Take 1 tablet (5 mg total) by mouth daily.    Dispense:  90 tablet    Refill:  3     Follow-up: No follow-ups on file.  AWalker Kehr MD

## 2020-09-15 NOTE — Assessment & Plan Note (Signed)
Cont w/Vit D 

## 2020-09-15 NOTE — Patient Instructions (Addendum)
For a mild COVID-19 case - take zinc 50 mg a day for 1 week, vitamin C 1000 mg daily for 1 week, vitamin D2 50,000 units weekly for 2 months (unless  taking vitamin D daily already), an antioxidant Quercetin 500 mg twice a day for 1 week (if you can get it quick enough). Take Allegra or Benadryl.  Maintain good oral hydration and take Tylenol for high fever.  Call if problems. Isolate for 5 days, then wear a mask for 5 days per CDC.    Cardiac CT calcium scoring test $99 Tel # is [phone]   Computed tomography, more commonly known as a CT or CAT scan, is a diagnostic medical imaging test. Like traditional x-rays, it produces multiple images or pictures of the inside of the body. The cross-sectional images generated during a CT scan can be reformatted in multiple planes. They can even generate three-dimensional images. These images can be viewed on a computer monitor, printed on film or by a 3D printer, or transferred to a CD or DVD. CT images of internal organs, bones, soft tissue and blood vessels provide greater detail than traditional x-rays, particularly of soft tissues and blood vessels. A cardiac CT scan for coronary calcium is a non-invasive way of obtaining information about the presence, location and extent of calcified plaque in the coronary arteries--the vessels that supply oxygen-containing blood to the heart muscle. Calcified plaque results when there is a build-up of fat and other substances under the inner layer of the artery. This material can calcify which signals the presence of atherosclerosis, a disease of the vessel wall, also called coronary artery disease (CAD). People with this disease have an increased risk for heart attacks. In addition, over time, progression of plaque build up (CAD) can narrow the arteries or even close off blood flow to the heart. The result may be chest pain, sometimes called "angina," or a heart attack. Because calcium is a marker of CAD, the amount of  calcium detected on a cardiac CT scan is a helpful prognostic tool. The findings on cardiac CT are expressed as a calcium score. Another name for this test is coronary artery calcium scoring.  What are some common uses of the procedure? The goal of cardiac CT scan for calcium scoring is to determine if CAD is present and to what extent, even if there are no symptoms. It is a screening study that may be recommended by a physician for patients with risk factors for CAD but no clinical symptoms. The major risk factors for CAD are: high blood cholesterol levels  family history of heart attacks  diabetes  high blood pressure  cigarette smoking  overweight or obese  physical inactivity   A negative cardiac CT scan for calcium scoring shows no calcification within the coronary arteries. This suggests that CAD is absent or so minimal it cannot be seen by this technique. The chance of having a heart attack over the next two to five years is very low under these circumstances. A positive test means that CAD is present, regardless of whether or not the patient is experiencing any symptoms. The amount of calcification--expressed as the calcium score--may help to predict the likelihood of a myocardial infarction (heart attack) in the coming years and helps your medical doctor or cardiologist decide whether the patient may need to take preventive medicine or undertake other measures such as diet and exercise to lower the risk for heart attack. The extent of CAD is graded according to your  calcium score:  Calcium Score  Presence of CAD (coronary artery disease)  0 No evidence of CAD   1-10 Minimal evidence of CAD  11-100 Mild evidence of CAD  101-400 Moderate evidence of CAD  Over 400 Extensive evidence of CAD   Coronary artery calcium (CAC) score is a strong predictor of incident coronary heart disease (CHD) and provides predictive information beyond traditional risk factors. CAC scoring is reasonable  to use in the decision to withhold, postpone, or initiate statin therapy in intermediate-risk or selected borderline-risk asymptomatic adults (age 19-75 years and LDL-C >=70 to <190 mg/dL) who do not have diabetes or established atherosclerotic cardiovascular disease (ASCVD).* In intermediate-risk (10-year ASCVD risk >=7.5% to <20%) adults or selected borderline-risk (10-year ASCVD risk >=5% to <7.5%) adults in whom a CAC score is measured for the purpose of making a treatment decision the following recommendations have been made:   If CAC=0, it is reasonable to withhold statin therapy and reassess in 5 to 10 years, as long as higher risk conditions are absent (diabetes mellitus, family history of premature CHD in first degree relatives (males <55 years; females <65 years), cigarette smoking, or LDL >=190 mg/dL).   If CAC is 1 to 99, it is reasonable to initiate statin therapy for patients >=33 years of age.   If CAC is >=100 or >=75th percentile, it is reasonable to initiate statin therapy at any age.   Cardiology referral should be considered for patients with CAC scores >=400 or >=75th percentile.   *2018 AHA/ACC/AACVPR/AAPA/ABC/ACPM/ADA/AGS/APhA/ASPC/NLA/PCNA Guideline on the Management of Blood Cholesterol: A Report of the American College of Cardiology/American Heart Association Task Force on Clinical Practice Guidelines. J Am Coll Cardiol. 2019;73(24):3168-3209.

## 2020-09-15 NOTE — Assessment & Plan Note (Signed)
  We discussed age appropriate health related issues, including available/recomended screening tests and vaccinations. Labs were ordered to be later reviewed . All questions were answered. We discussed one or more of the following - seat belt use, use of sunscreen/sun exposure exercise, fall risk reduction, second hand smoke exposure, firearm use and storage, seat belt use, a need for adhering to healthy diet and exercise. Labs were discussed. All questions were answered. A  cardiac CT scan for calcium scoring test offered  Colon 2015 due in 2025 Hep C(-)

## 2020-10-10 DIAGNOSIS — C61 Malignant neoplasm of prostate: Secondary | ICD-10-CM | POA: Diagnosis not present

## 2020-10-17 DIAGNOSIS — R351 Nocturia: Secondary | ICD-10-CM | POA: Diagnosis not present

## 2020-10-17 DIAGNOSIS — C61 Malignant neoplasm of prostate: Secondary | ICD-10-CM | POA: Diagnosis not present

## 2020-10-17 DIAGNOSIS — N401 Enlarged prostate with lower urinary tract symptoms: Secondary | ICD-10-CM | POA: Diagnosis not present

## 2021-05-28 ENCOUNTER — Encounter: Payer: Self-pay | Admitting: Internal Medicine

## 2021-06-12 DIAGNOSIS — C61 Malignant neoplasm of prostate: Secondary | ICD-10-CM | POA: Diagnosis not present

## 2021-06-19 DIAGNOSIS — N401 Enlarged prostate with lower urinary tract symptoms: Secondary | ICD-10-CM | POA: Diagnosis not present

## 2021-06-19 DIAGNOSIS — C61 Malignant neoplasm of prostate: Secondary | ICD-10-CM | POA: Diagnosis not present

## 2021-06-19 DIAGNOSIS — R351 Nocturia: Secondary | ICD-10-CM | POA: Diagnosis not present

## 2021-09-01 ENCOUNTER — Ambulatory Visit (INDEPENDENT_AMBULATORY_CARE_PROVIDER_SITE_OTHER): Payer: PPO

## 2021-09-01 ENCOUNTER — Ambulatory Visit (INDEPENDENT_AMBULATORY_CARE_PROVIDER_SITE_OTHER): Payer: PPO | Admitting: Internal Medicine

## 2021-09-01 ENCOUNTER — Encounter: Payer: Self-pay | Admitting: Internal Medicine

## 2021-09-01 VITALS — BP 104/70 | HR 69 | Temp 97.7°F | Ht 64.0 in | Wt 137.8 lb

## 2021-09-01 DIAGNOSIS — E559 Vitamin D deficiency, unspecified: Secondary | ICD-10-CM | POA: Diagnosis not present

## 2021-09-01 DIAGNOSIS — R972 Elevated prostate specific antigen [PSA]: Secondary | ICD-10-CM | POA: Diagnosis not present

## 2021-09-01 DIAGNOSIS — E785 Hyperlipidemia, unspecified: Secondary | ICD-10-CM

## 2021-09-01 DIAGNOSIS — Z0000 Encounter for general adult medical examination without abnormal findings: Secondary | ICD-10-CM

## 2021-09-01 DIAGNOSIS — H9313 Tinnitus, bilateral: Secondary | ICD-10-CM

## 2021-09-01 DIAGNOSIS — H9319 Tinnitus, unspecified ear: Secondary | ICD-10-CM | POA: Insufficient documentation

## 2021-09-01 LAB — LIPID PANEL
Cholesterol: 243 mg/dL — ABNORMAL HIGH (ref 0–200)
HDL: 55.8 mg/dL (ref >39.00)
LDL Cholesterol: 154 mg/dL — ABNORMAL HIGH (ref 0–99)
NonHDL: 187.65
Total CHOL/HDL Ratio: 4
Triglycerides: 168 mg/dL — ABNORMAL HIGH (ref 0.0–149.0)
VLDL: 33.6 mg/dL (ref 0.0–40.0)

## 2021-09-01 LAB — COMPREHENSIVE METABOLIC PANEL
ALT: 14 U/L (ref 0–53)
AST: 14 U/L (ref 0–37)
Albumin: 4.3 g/dL (ref 3.5–5.2)
Alkaline Phosphatase: 83 U/L (ref 39–117)
BUN: 13 mg/dL (ref 6–23)
CO2: 30 mEq/L (ref 19–32)
Calcium: 9.4 mg/dL (ref 8.4–10.5)
Chloride: 99 mEq/L (ref 96–112)
Creatinine, Ser: 1.08 mg/dL (ref 0.40–1.50)
GFR: 69.05 mL/min (ref >60.00)
Glucose, Bld: 92 mg/dL (ref 70–99)
Potassium: 4.6 mEq/L (ref 3.5–5.1)
Sodium: 136 mEq/L (ref 135–145)
Total Bilirubin: 1.3 mg/dL — ABNORMAL HIGH (ref 0.2–1.2)
Total Protein: 6.7 g/dL (ref 6.0–8.3)

## 2021-09-01 LAB — CBC WITH DIFFERENTIAL/PLATELET
Basophils Absolute: 0.1 10*3/uL (ref 0.0–0.1)
Basophils Relative: 1.2 % (ref 0.0–3.0)
Eosinophils Absolute: 0.1 10*3/uL (ref 0.0–0.7)
Eosinophils Relative: 1 % (ref 0.0–5.0)
HCT: 49.6 % (ref 39.0–52.0)
Hemoglobin: 16.5 g/dL (ref 13.0–17.0)
Lymphocytes Relative: 33.7 % (ref 12.0–46.0)
Lymphs Abs: 1.7 10*3/uL (ref 0.7–4.0)
MCHC: 33.2 g/dL (ref 30.0–36.0)
MCV: 86.3 fl (ref 78.0–100.0)
Monocytes Absolute: 0.5 10*3/uL (ref 0.1–1.0)
Monocytes Relative: 9.9 % (ref 3.0–12.0)
Neutro Abs: 2.7 10*3/uL (ref 1.4–7.7)
Neutrophils Relative %: 54.2 % (ref 43.0–77.0)
Platelets: 168 10*3/uL (ref 150.0–400.0)
RBC: 5.75 Mil/uL (ref 4.22–5.81)
RDW: 15 % (ref 11.5–15.5)
WBC: 5 10*3/uL (ref 4.0–10.5)

## 2021-09-01 LAB — PSA: PSA: 9.98 ng/mL — ABNORMAL HIGH (ref 0.10–4.00)

## 2021-09-01 LAB — URINALYSIS
Bilirubin Urine: NEGATIVE
Hgb urine dipstick: NEGATIVE
Ketones, ur: NEGATIVE
Leukocytes,Ua: NEGATIVE
Nitrite: NEGATIVE
Specific Gravity, Urine: 1.01 (ref 1.000–1.030)
Total Protein, Urine: NEGATIVE
Urine Glucose: NEGATIVE
Urobilinogen, UA: 0.2 (ref 0.0–1.0)
pH: 7.5 (ref 5.0–8.0)

## 2021-09-01 LAB — TSH: TSH: 2.35 u[IU]/mL (ref 0.35–5.50)

## 2021-09-01 NOTE — Assessment & Plan Note (Signed)
Rectal - per Urology q 6 months

## 2021-09-01 NOTE — Assessment & Plan Note (Addendum)
We discussed age appropriate health related issues, including available/recomended screening tests and vaccinations. Labs were ordered to be later reviewed . All questions were answered. We discussed one or more of the following - seat belt use, use of sunscreen/sun exposure exercise, fall risk reduction, second hand smoke exposure, firearm use and storage, seat belt use, a need for adhering to healthy diet and exercise. Labs were discussed. All questions were answered. A  cardiac CT scan for calcium scoring test offered - declined  Rectal - per Urology q 6 months

## 2021-09-01 NOTE — Assessment & Plan Note (Signed)
Pt declined statins 

## 2021-09-01 NOTE — Progress Notes (Cosign Needed Addendum)
Subjective:   Antonio Yu is a 71 y.o. male who presents for Medicare Annual/Subsequent preventive examination.  Review of Systems     Cardiac Risk Factors include: advanced age (>30mn, >>50women);male gender     Objective:    Today's Vitals   09/01/21 1334  BP: 104/70  Pulse: 69  Temp: 97.7 F (36.5 C)  SpO2: 96%  Weight: 137 lb 12.8 oz (62.5 kg)  Height: 5' 4"  (1.626 m)  PainSc: 0-No pain   Body mass index is 23.65 kg/m.     09/01/2021    1:44 PM 07/01/2017   11:46 AM  Advanced Directives  Does Patient Have a Medical Advance Directive? No No  Would patient like information on creating a medical advance directive? No - Patient declined No - Patient declined    Current Medications (verified) Outpatient Encounter Medications as of 09/01/2021  Medication Sig   Cholecalciferol (VITAMIN D) 50 MCG (2000 UT) CAPS 1 po qd   finasteride (PROSCAR) 5 MG tablet Take 1 tablet (5 mg total) by mouth daily.   vitamin B-12 (CYANOCOBALAMIN) 1000 MCG tablet Take 1 tablet (1,000 mcg total) by mouth daily.   triamcinolone ointment (KENALOG) 0.1 % Apply 1 application topically 2 (two) times daily as needed. (Patient not taking: Reported on 09/01/2021)   No facility-administered encounter medications on file as of 09/01/2021.    Allergies (verified) Patient has no known allergies.   History: Past Medical History:  Diagnosis Date   BPH (benign prostatic hyperplasia)    Elevated PSA    Past Surgical History:  Procedure Laterality Date   APPENDECTOMY     HERNIA REPAIR Bilateral 2017   Family History  Problem Relation Age of Onset   Cancer Mother 539  Dementia Father 863  Heart disease Father        CAD   Multiple sclerosis Brother    Social History   Socioeconomic History   Marital status: Married    Spouse name: Not on file   Number of children: 1   Years of education: Not on file   Highest education level: Not on file  Occupational History   Not on file   Tobacco Use   Smoking status: Never   Smokeless tobacco: Never  Vaping Use   Vaping Use: Never used  Substance and Sexual Activity   Alcohol use: No   Drug use: No   Sexual activity: Yes  Other Topics Concern   Not on file  Social History Narrative   Not on file   Social Determinants of Health   Financial Resource Strain: Low Risk  (09/01/2021)   Overall Financial Resource Strain (CARDIA)    Difficulty of Paying Living Expenses: Not hard at all  Food Insecurity: No Food Insecurity (09/01/2021)   Hunger Vital Sign    Worried About Running Out of Food in the Last Year: Never true    Ran Out of Food in the Last Year: Never true  Transportation Needs: No Transportation Needs (09/01/2021)   PRAPARE - THydrologist(Medical): No    Lack of Transportation (Non-Medical): No  Physical Activity: Sufficiently Active (09/01/2021)   Exercise Vital Sign    Days of Exercise per Week: 5 days    Minutes of Exercise per Session: 30 min  Stress: No Stress Concern Present (09/01/2021)   [place]   Feeling of Stress : Not at all  Social Connections: Moderately  Isolated (09/01/2021)   Social Connection and Isolation Panel [NHANES]    Frequency of Communication with Friends and Family: More than three times a week    Frequency of Social Gatherings with Friends and Family: More than three times a week    Attends Religious Services: Never    Marine scientist or Organizations: No    Attends Music therapist: Never    Marital Status: Married    Tobacco Counseling Counseling given: Not Answered   Clinical Intake:  Pre-visit preparation completed: Yes  Pain : No/denies pain Pain Score: 0-No pain     BMI - recorded: 23.65 Nutritional Status: BMI of 19-24  Normal Nutritional Risks: None Diabetes: No  How often do you need to have someone help you when you read instructions,  pamphlets, or other written materials from your doctor or pharmacy?: 1 - Never What is the last grade level you completed in school?: Bachelor's Degree  Diabetic? no  Interpreter Needed?: No  Information entered by :: Lisette Abu, LPN.   Activities of Daily Living    09/01/2021    2:02 PM  In your present state of health, do you have any difficulty performing the following activities:  Hearing? 0  Vision? 0  Difficulty concentrating or making decisions? 0  Walking or climbing stairs? 0  Dressing or bathing? 0  Doing errands, shopping? 0  Preparing Food and eating ? N  Using the Toilet? N  In the past six months, have you accidently leaked urine? N  Do you have problems with loss of bowel control? N  Managing your Medications? N  Managing your Finances? N  Housekeeping or managing your Housekeeping? N    Patient Care Team: Plotnikov, Evie Lacks, MD as PCP - General (Internal Medicine) Raynelle Bring, MD as Consulting Physician (Urology) Idolina Primer Warnell Bureau as Consulting Physician (Optometry)  Indicate any recent Medical Services you may have received from other than [facility] providers in the past year (date may be approximate).     Assessment:   This is a routine wellness examination for Antonio Yu.  Hearing/Vision screen Hearing Screening - Comments:: Patient denied any hearing difficulty.   No hearing aids.  Vision Screening - Comments:: Patient does wear readers.  Eye exam done by: Alois Cliche, OD.   Dietary issues and exercise activities discussed: Current Exercise Habits: Home exercise routine, Type of exercise: Other - see comments;walking (ellipitical bike), Time (Minutes): 30, Frequency (Times/Week): 5, Weekly Exercise (Minutes/Week): 150, Intensity: Moderate, Exercise limited by: None identified   Goals Addressed             This Visit's Progress    Patient declined health goal at this time.        Depression Screen    09/01/2021    1:58 PM 09/15/2020     2:05 PM 07/01/2017   11:49 AM 07/01/2017   10:37 AM  PHQ 2/9 Scores  PHQ - 2 Score 0 0 0 0  PHQ- 9 Score  0 0     Fall Risk    09/01/2021    2:01 PM 09/15/2020    2:05 PM 01/17/2019   11:02 AM 07/01/2017   11:49 AM 07/01/2017   10:37 AM  Fall Risk   Falls in the past year? 0 0 0 No No  Comment   Emmi Telephone Survey: data to providers prior to load    Number falls in past yr: 0 0     Injury with Fall? 0 0  Risk for fall due to : No Fall Risks No Fall Risks     Follow up Falls evaluation completed        FALL RISK PREVENTION PERTAINING TO THE HOME:  Any stairs in or around the home? No  If so, are there any without handrails? No  Home free of loose throw rugs in walkways, pet beds, electrical cords, etc? Yes  Adequate lighting in your home to reduce risk of falls? Yes   ASSISTIVE DEVICES UTILIZED TO PREVENT FALLS:  Life alert? No  Use of a cane, walker or w/c? No  Grab bars in the bathroom? Yes  Shower chair or bench in shower? No  Elevated toilet seat or a handicapped toilet? No   TIMED UP AND GO:  Was the test performed? Yes .  Length of time to ambulate 10 feet: 6 sec.   Gait steady and fast without use of assistive device  Cognitive Function:        09/01/2021    2:02 PM  6CIT Screen  What Year? 0 points  What month? 0 points  What time? 0 points  Count back from 20 0 points  Months in reverse 0 points  Repeat phrase 0 points  Total Score 0 points    Immunizations Immunization History  Administered Date(s) Administered   MMR 05/11/2014   PFIZER(Purple Top)SARS-COV-2 Vaccination 05/08/2019, 05/29/2019   Pneumococcal Conjugate-13 12/25/2019   Pneumococcal Polysaccharide-23 11/16/2018   Tdap 01/11/2012    TDAP status: Up to date  Flu Vaccine status: Declined, Education has been provided regarding the importance of this vaccine but patient still declined. Advised may receive this vaccine at local pharmacy or Health Dept. Aware to provide a copy of  the vaccination record if obtained from local pharmacy or Health Dept. Verbalized acceptance and understanding.  Pneumococcal vaccine status: Up to date  Covid-19 vaccine status: Completed vaccines  Qualifies for Shingles Vaccine? Yes   Zostavax completed No   Shingrix Completed?: No.    Education has been provided regarding the importance of this vaccine. Patient has been advised to call insurance company to determine out of pocket expense if they have not yet received this vaccine. Advised may also receive vaccine at local pharmacy or Health Dept. Verbalized acceptance and understanding.  Screening Tests Health Maintenance  Topic Date Due   Zoster Vaccines- Shingrix (1 of 2) Never done   COVID-19 Vaccine (3 - Pfizer series) 07/24/2019   INFLUENZA VACCINE  09/22/2021   TETANUS/TDAP  01/10/2022   COLONOSCOPY (Pts 45-16yr Insurance coverage will need to be confirmed)  05/22/2023   Pneumonia Vaccine 71 Years old  Completed   Hepatitis C Screening  Completed   HPV VACCINES  Aged Out    Health Maintenance  Health Maintenance Due  Topic Date Due   Zoster Vaccines- Shingrix (1 of 2) Never done   COVID-19 Vaccine (3 - Pfizer series) 07/24/2019    Colorectal cancer screening: Type of screening: Colonoscopy. Completed 05/21/2013. Repeat every 10 years  Lung Cancer Screening: (Low Dose CT Chest recommended if Age 71-80years, 30 pack-year currently smoking OR have quit w/in 15years.) does not qualify.   Lung Cancer Screening Referral: no  Additional Screening:  Hepatitis C Screening: does qualify; Completed 05/28/2015  Vision Screening: Recommended annual ophthalmology exams for early detection of glaucoma and other disorders of the eye. Is the patient up to date with their annual eye exam?  Yes  Who is the provider or what is the name of the  office in which the patient attends annual eye exams? Alois Cliche, OD. If pt is not established with a provider, would they like to be referred  to a provider to establish care? No .   Dental Screening: Recommended annual dental exams for proper oral hygiene  Community Resource Referral / Chronic Care Management: CRR required this visit?  No   CCM required this visit?  No      Plan:     I have personally reviewed and noted the following in the patient's chart:   Medical and social history Use of alcohol, tobacco or illicit drugs  Current medications and supplements including opioid prescriptions. Patient is not currently taking opioid prescriptions. Functional ability and status Nutritional status Physical activity Advanced directives List of other physicians Hospitalizations, surgeries, and ER visits in previous 12 months Vitals Screenings to include cognitive, depression, and falls Referrals and appointments  In addition, I have reviewed and discussed with patient certain preventive protocols, quality metrics, and best practice recommendations. A written personalized care plan for preventive services as well as general preventive health recommendations were provided to patient.     Sheral Flow, LPN   09/06/9676   Nurse Notes:  Patient is cogitatively intact.   Medical screening examination/treatment/procedure(s) were performed by non-physician practitioner and as supervising physician I was immediately available for consultation/collaboration.  I agree with above. Lew Dawes, MD

## 2021-09-01 NOTE — Patient Instructions (Signed)
Antonio Yu , Thank you for taking time to come for your Medicare Wellness Visit. I appreciate your ongoing commitment to your health goals. Please review the following plan we discussed and let me know if I can assist you in the future.   Screening recommendations/referrals: Colonoscopy: 05/21/2013; due every 10 years (due 05/22/2023) Recommended yearly ophthalmology/optometry visit for glaucoma screening and checkup Recommended yearly dental visit for hygiene and checkup  Vaccinations: Influenza vaccine: declined Pneumococcal vaccine: 11/16/2018, 12/25/2019 Tdap vaccine: 01/11/2012; due every 10 years (due 01/10/2022) Shingles vaccine: never done   Covid-19: 05/08/2019, 05/29/2019  Advanced directives: No  Conditions/risks identified: Yes  Next appointment: Please schedule your next Medicare Wellness Visit with your Nurse Health Advisor in 1 year by calling [phone].  Preventive Care 71 Years and Older, Male Preventive care refers to lifestyle choices and visits with your health care provider that can promote health and wellness. What does preventive care include? A yearly physical exam. This is also called an annual well check. Dental exams once or twice a year. Routine eye exams. Ask your health care provider how often you should have your eyes checked. Personal lifestyle choices, including: Daily care of your teeth and gums. Regular physical activity. Eating a healthy diet. Avoiding tobacco and drug use. Limiting alcohol use. Practicing safe sex. Taking low doses of aspirin every day. Taking vitamin and mineral supplements as recommended by your health care provider. What happens during an annual well check? The services and screenings done by your health care provider during your annual well check will depend on your age, overall health, lifestyle risk factors, and family history of disease. Counseling  Your health care provider may ask you questions about your: Alcohol  use. Tobacco use. Drug use. Emotional well-being. Home and relationship well-being. Sexual activity. Eating habits. History of falls. Memory and ability to understand (cognition). Work and work Statistician. Screening  You may have the following tests or measurements: Height, weight, and BMI. Blood pressure. Lipid and cholesterol levels. These may be checked every 5 years, or more frequently if you are over 58 years old. Skin check. Lung cancer screening. You may have this screening every year starting at age 78 if you have a 30-pack-year history of smoking and currently smoke or have quit within the past 15 years. Fecal occult blood test (FOBT) of the stool. You may have this test every year starting at age 38. Flexible sigmoidoscopy or colonoscopy. You may have a sigmoidoscopy every 5 years or a colonoscopy every 10 years starting at age 53. Prostate cancer screening. Recommendations will vary depending on your family history and other risks. Hepatitis C blood test. Hepatitis B blood test. Sexually transmitted disease (STD) testing. Diabetes screening. This is done by checking your blood sugar (glucose) after you have not eaten for a while (fasting). You may have this done every 1-3 years. Abdominal aortic aneurysm (AAA) screening. You may need this if you are a current or former smoker. Osteoporosis. You may be screened starting at age 54 if you are at high risk. Talk with your health care provider about your test results, treatment options, and if necessary, the need for more tests. Vaccines  Your health care provider may recommend certain vaccines, such as: Influenza vaccine. This is recommended every year. Tetanus, diphtheria, and acellular pertussis (Tdap, Td) vaccine. You may need a Td booster every 10 years. Zoster vaccine. You may need this after age 62. Pneumococcal 13-valent conjugate (PCV13) vaccine. One dose is recommended after age 20. Pneumococcal  polysaccharide  (PPSV23) vaccine. One dose is recommended after age 74. Talk to your health care provider about which screenings and vaccines you need and how often you need them. This information is not intended to replace advice given to you by your health care provider. Make sure you discuss any questions you have with your health care provider. Document Released: 03/07/2015 Document Revised: 10/29/2015 Document Reviewed: 12/10/2014 Elsevier Interactive Patient Education  2017 [place] Prevention in the Home Falls can cause injuries. They can happen to people of all ages. There are many things you can do to make your home safe and to help prevent falls. What can I do on the outside of my home? Regularly fix the edges of walkways and driveways and fix any cracks. Remove anything that might make you trip as you walk through a door, such as a raised step or threshold. Trim any bushes or trees on the path to your home. Use bright outdoor lighting. Clear any walking paths of anything that might make someone trip, such as rocks or tools. Regularly check to see if handrails are loose or broken. Make sure that both sides of any steps have handrails. Any raised decks and porches should have guardrails on the edges. Have any leaves, snow, or ice cleared regularly. Use sand or salt on walking paths during winter. Clean up any spills in your garage right away. This includes oil or grease spills. What can I do in the bathroom? Use night lights. Install grab bars by the toilet and in the tub and shower. Do not use towel bars as grab bars. Use non-skid mats or decals in the tub or shower. If you need to sit down in the shower, use a plastic, non-slip stool. Keep the floor dry. Clean up any water that spills on the floor as soon as it happens. Remove soap buildup in the tub or shower regularly. Attach bath mats securely with double-sided non-slip rug tape. Do not have throw rugs and other things on the  floor that can make you trip. What can I do in the bedroom? Use night lights. Make sure that you have a light by your bed that is easy to reach. Do not use any sheets or blankets that are too big for your bed. They should not hang down onto the floor. Have a firm chair that has side arms. You can use this for support while you get dressed. Do not have throw rugs and other things on the floor that can make you trip. What can I do in the kitchen? Clean up any spills right away. Avoid walking on wet floors. Keep items that you use a lot in easy-to-reach places. If you need to reach something above you, use a strong step stool that has a grab bar. Keep electrical cords out of the way. Do not use floor polish or wax that makes floors slippery. If you must use wax, use non-skid floor wax. Do not have throw rugs and other things on the floor that can make you trip. What can I do with my stairs? Do not leave any items on the stairs. Make sure that there are handrails on both sides of the stairs and use them. Fix handrails that are broken or loose. Make sure that handrails are as long as the stairways. Check any carpeting to make sure that it is firmly attached to the stairs. Fix any carpet that is loose or worn. Avoid having throw rugs at the top  or bottom of the stairs. If you do have throw rugs, attach them to the floor with carpet tape. Make sure that you have a light switch at the top of the stairs and the bottom of the stairs. If you do not have them, ask someone to add them for you. What else can I do to help prevent falls? Wear shoes that: Do not have high heels. Have rubber bottoms. Are comfortable and fit you well. Are closed at the toe. Do not wear sandals. If you use a stepladder: Make sure that it is fully opened. Do not climb a closed stepladder. Make sure that both sides of the stepladder are locked into place. Ask someone to hold it for you, if possible. Clearly mark and make  sure that you can see: Any grab bars or handrails. First and last steps. Where the edge of each step is. Use tools that help you move around (mobility aids) if they are needed. These include: Canes. Walkers. Scooters. Crutches. Turn on the lights when you go into a dark area. Replace any light bulbs as soon as they burn out. Set up your furniture so you have a clear path. Avoid moving your furniture around. If any of your floors are uneven, fix them. If there are any pets around you, be aware of where they are. Review your medicines with your doctor. Some medicines can make you feel dizzy. This can increase your chance of falling. Ask your doctor what other things that you can do to help prevent falls. This information is not intended to replace advice given to you by your health care provider. Make sure you discuss any questions you have with your health care provider. Document Released: 12/05/2008 Document Revised: 07/17/2015 Document Reviewed: 03/15/2014 Elsevier Interactive Patient Education  2017 [employer].

## 2021-09-01 NOTE — Assessment & Plan Note (Signed)
Discussed.

## 2021-09-01 NOTE — Assessment & Plan Note (Signed)
On Vit D 

## 2021-09-01 NOTE — Progress Notes (Signed)
Subjective:  Patient ID: Antonio Yu, male    DOB: 1950/08/31  Age: 71 y.o. MRN: [id number]  CC: No chief complaint on file.   HPI Antonio Yu presents for a well exam  Outpatient Medications Prior to Visit  Medication Sig Dispense Refill   Cholecalciferol (VITAMIN D) 50 MCG (2000 UT) CAPS 1 po qd 100 capsule 3   finasteride (PROSCAR) 5 MG tablet Take 1 tablet (5 mg total) by mouth daily. 90 tablet 3   vitamin B-12 (CYANOCOBALAMIN) 1000 MCG tablet Take 1 tablet (1,000 mcg total) by mouth daily. 100 tablet 3   triamcinolone ointment (KENALOG) 0.1 % Apply 1 application topically 2 (two) times daily as needed. (Patient not taking: Reported on 09/01/2021) 80 g 2   No facility-administered medications prior to visit.    ROS: Review of Systems  Constitutional:  Negative for appetite change, fatigue and unexpected weight change.  HENT:  Positive for tinnitus. Negative for congestion, hearing loss, nosebleeds, sneezing, sore throat and trouble swallowing.   Eyes:  Negative for itching and visual disturbance.  Respiratory:  Negative for cough.   Cardiovascular:  Negative for chest pain, palpitations and leg swelling.  Gastrointestinal:  Negative for abdominal distention, blood in stool, diarrhea and nausea.  Genitourinary:  Negative for frequency and hematuria.  Musculoskeletal:  Negative for back pain, gait problem, joint swelling and neck pain.  Skin:  Negative for rash.  Neurological:  Negative for dizziness, tremors, speech difficulty and weakness.  Psychiatric/Behavioral:  Negative for agitation, dysphoric mood and sleep disturbance. The patient is not nervous/anxious.     Objective:  BP 104/70 (BP Location: Left Arm, Patient Position: Sitting, Cuff Size: Normal)   Pulse 69   Temp 97.7 F (36.5 C) (Oral)   Ht '5\' 4"'$  (1.626 m)   Wt 137 lb 8 oz (62.4 kg)   SpO2 96%   BMI 23.60 kg/m   BP Readings from Last 3 Encounters:  09/01/21 104/70  09/01/21 104/70   12/25/19 130/82    Wt Readings from Last 3 Encounters:  09/01/21 137 lb 8 oz (62.4 kg)  09/01/21 137 lb 12.8 oz (62.5 kg)  12/25/19 146 lb 12.8 oz (66.6 kg)    Physical Exam Constitutional:      General: He is not in acute distress.    Appearance: Normal appearance. He is well-developed.     Comments: NAD  Eyes:     Conjunctiva/sclera: Conjunctivae normal.     Pupils: Pupils are equal, round, and reactive to light.  Neck:     Thyroid: No thyromegaly.     Vascular: No JVD.  Cardiovascular:     Rate and Rhythm: Normal rate and regular rhythm.     Heart sounds: Normal heart sounds. No murmur heard.    No friction rub. No gallop.  Pulmonary:     Effort: Pulmonary effort is normal. No respiratory distress.     Breath sounds: Normal breath sounds. No wheezing or rales.  Chest:     Chest wall: No tenderness.  Abdominal:     General: Bowel sounds are normal. There is no distension.     Palpations: Abdomen is soft. There is no mass.     Tenderness: There is no abdominal tenderness. There is no guarding or rebound.  Musculoskeletal:        General: No tenderness. Normal range of motion.     Cervical back: Normal range of motion.  Lymphadenopathy:     Cervical: No cervical adenopathy.  Skin:  General: Skin is warm and dry.     Findings: No rash.  Neurological:     Mental Status: He is alert and oriented to person, place, and time.     Cranial Nerves: No cranial nerve deficit.     Motor: No abnormal muscle tone.     Coordination: Coordination normal.     Gait: Gait normal.     Deep Tendon Reflexes: Reflexes are normal and symmetric.  Psychiatric:        Behavior: Behavior normal.        Thought Content: Thought content normal.        Judgment: Judgment normal.   Rectal - per Urology q 6 months  Lab Results  Component Value Date   WBC 4.4 09/15/2020   HGB 16.1 09/15/2020   HCT 48.4 09/15/2020   PLT 150.0 09/15/2020   GLUCOSE 97 09/15/2020   CHOL 252 (H) 09/15/2020    TRIG 193.0 (H) 09/15/2020   HDL 49.10 09/15/2020   LDLDIRECT 153.0 12/25/2019   LDLCALC 165 (H) 09/15/2020   ALT 12 09/15/2020   AST 12 09/15/2020   NA 140 09/15/2020   K 4.3 09/15/2020   CL 103 09/15/2020   CREATININE 1.06 09/15/2020   BUN 11 09/15/2020   CO2 29 09/15/2020   TSH 2.39 09/15/2020   PSA 7.98 (H) 09/15/2020    MR PROSTATE W WO CONTRAST  Result Date: 01/31/2018 CLINICAL DATA:  Prostate cancer, Gleason 3+3=6 adenocarcinoma the left lateral base. Continued elevation in PSA level. EXAM: MR PROSTATE WITHOUT AND WITH CONTRAST TECHNIQUE: Multiplanar multisequence MRI images were obtained of the pelvis centered about the prostate. Pre and post contrast images were obtained. CONTRAST:  35m MULTIHANCE GADOBENATE DIMEGLUMINE 529 MG/ML IV SOLN [facility] [address] Results: Creatinine 1.2 mg/dL. COMPARISON:  Prostate MRI from 11/08/2016 FINDINGS: Prostate: Overall prominent transition zone/central gland with various nodules most of which are T2 hyperintense. The several T2 hypointense nodules are relatively well-defined and not associated with significantly activity on high B value diffusion-weighted images. A small PI-RADS category 3 lesion in the right posterolateral peripheral zone apex has a volume of 0.1 cubic cm and measures 0.7 by 0.2 by 0.5 cm. No appreciable abnormal early enhancement or abnormal activity on diffusion-weighted images. There is some linear bandlike PI-RADS category 2 appearance of the left prostate apex for example on images 15 through 17 of series 7, without other worrisome characteristics. Volume: 3D volumetric analysis: 75.14 cubic cm (6.0 by 5.1 by 5.3 cm). Transcapsular spread:  Absent Seminal vesicle involvement: Absent Neurovascular bundle involvement: Absent Pelvic adenopathy: Absent Bone metastasis: Absent Other findings: No supplemental non-categorized findings. IMPRESSION: 1. Small PI-RADS category 3  lesion in the right posterolateral apex peripheral zone. There is some minimal PI-RADS category 2 bandlike signal in the left prostate apex. Appropriate targeting data for the PI-RADS category 3 [facility]. Prostate volume: 75.14 cubic cm. 3. Benign prostatic hypertrophy. Electronically Signed   By: WVan ClinesM.D.   On: 01/31/2018 14:46    Assessment & Plan:   Problem List Items Addressed This Visit     Dyslipidemia    Pt declined statins      Relevant Orders   Lipid panel   Comprehensive metabolic panel   Elevated PSA    Rectal - per Urology q 6 months      Tinnitus    Discussed      Vitamin D deficiency  On Vit D      Well adult exam    We discussed age appropriate health related issues, including available/recomended screening tests and vaccinations. Labs were ordered to be later reviewed . All questions were answered. We discussed one or more of the following - seat belt use, use of sunscreen/sun exposure exercise, fall risk reduction, second hand smoke exposure, firearm use and storage, seat belt use, a need for adhering to healthy diet and exercise. Labs were discussed. All questions were answered. A  cardiac CT scan for calcium scoring test offered - declined  Rectal - per Urology q 6 months      Relevant Orders   TSH   Urinalysis   CBC with Differential/Platelet   Lipid panel   PSA   Comprehensive metabolic panel      No orders of the defined types were placed in this encounter.     Follow-up: Return in about 1 year (around 09/02/2022) for a follow-up visit.  Walker Kehr, MD

## 2021-10-08 ENCOUNTER — Other Ambulatory Visit: Payer: Self-pay | Admitting: Internal Medicine

## 2021-12-18 DIAGNOSIS — C61 Malignant neoplasm of prostate: Secondary | ICD-10-CM | POA: Diagnosis not present

## 2021-12-25 DIAGNOSIS — C61 Malignant neoplasm of prostate: Secondary | ICD-10-CM | POA: Diagnosis not present

## 2021-12-25 DIAGNOSIS — N401 Enlarged prostate with lower urinary tract symptoms: Secondary | ICD-10-CM | POA: Diagnosis not present

## 2021-12-25 DIAGNOSIS — R351 Nocturia: Secondary | ICD-10-CM | POA: Diagnosis not present

## 2022-06-23 DIAGNOSIS — C61 Malignant neoplasm of prostate: Secondary | ICD-10-CM | POA: Diagnosis not present

## 2022-06-30 DIAGNOSIS — C61 Malignant neoplasm of prostate: Secondary | ICD-10-CM | POA: Diagnosis not present

## 2022-06-30 DIAGNOSIS — R351 Nocturia: Secondary | ICD-10-CM | POA: Diagnosis not present

## 2022-06-30 DIAGNOSIS — N401 Enlarged prostate with lower urinary tract symptoms: Secondary | ICD-10-CM | POA: Diagnosis not present

## 2022-08-04 ENCOUNTER — Encounter: Payer: PPO | Admitting: Internal Medicine

## 2022-09-06 ENCOUNTER — Ambulatory Visit (INDEPENDENT_AMBULATORY_CARE_PROVIDER_SITE_OTHER): Payer: PPO

## 2022-09-06 ENCOUNTER — Encounter: Payer: Self-pay | Admitting: Internal Medicine

## 2022-09-06 ENCOUNTER — Ambulatory Visit (INDEPENDENT_AMBULATORY_CARE_PROVIDER_SITE_OTHER): Payer: PPO | Admitting: Internal Medicine

## 2022-09-06 VITALS — BP 118/78 | HR 68 | Temp 98.2°F | Ht 64.0 in | Wt 138.0 lb

## 2022-09-06 VITALS — BP 118/78 | HR 68 | Temp 98.2°F | Resp 16 | Ht 64.0 in | Wt 138.6 lb

## 2022-09-06 DIAGNOSIS — N320 Bladder-neck obstruction: Secondary | ICD-10-CM | POA: Diagnosis not present

## 2022-09-06 DIAGNOSIS — E559 Vitamin D deficiency, unspecified: Secondary | ICD-10-CM

## 2022-09-06 DIAGNOSIS — Z0000 Encounter for general adult medical examination without abnormal findings: Secondary | ICD-10-CM

## 2022-09-06 DIAGNOSIS — R972 Elevated prostate specific antigen [PSA]: Secondary | ICD-10-CM | POA: Diagnosis not present

## 2022-09-06 LAB — CBC WITH DIFFERENTIAL/PLATELET
Basophils Absolute: 0.1 10*3/uL (ref 0.0–0.1)
Basophils Relative: 1.6 % (ref 0.0–3.0)
Eosinophils Absolute: 0.1 10*3/uL (ref 0.0–0.7)
Eosinophils Relative: 1.4 % (ref 0.0–5.0)
HCT: 48.4 % (ref 39.0–52.0)
Hemoglobin: 15.9 g/dL (ref 13.0–17.0)
Lymphocytes Relative: 27.9 % (ref 12.0–46.0)
Lymphs Abs: 1.3 10*3/uL (ref 0.7–4.0)
MCHC: 32.9 g/dL (ref 30.0–36.0)
MCV: 86.3 fl (ref 78.0–100.0)
Monocytes Absolute: 0.5 10*3/uL (ref 0.1–1.0)
Monocytes Relative: 10.1 % (ref 3.0–12.0)
Neutro Abs: 2.6 10*3/uL (ref 1.4–7.7)
Neutrophils Relative %: 59 % (ref 43.0–77.0)
Platelets: 152 10*3/uL (ref 150.0–400.0)
RBC: 5.61 Mil/uL (ref 4.22–5.81)
RDW: 14.4 % (ref 11.5–15.5)
WBC: 4.5 10*3/uL (ref 4.0–10.5)

## 2022-09-06 LAB — TSH: TSH: 2.21 u[IU]/mL (ref 0.35–5.50)

## 2022-09-06 NOTE — Progress Notes (Addendum)
Subjective:   Antonio Yu is a 72 y.o. male who presents for Medicare Annual/Subsequent preventive examination.  Visit Complete: In person  Patient Medicare AWV questionnaire was completed by the patient on 09/03/2022; I have confirmed that all information answered by patient is correct and no changes since this date.  Review of Systems     Cardiac Risk Factors include: advanced age (>62men, >53 women);family history of premature cardiovascular disease;dyslipidemia;male gender     Objective:    Today's Vitals   09/06/22 1331  BP: 118/78  Pulse: 68  Resp: 16  Temp: 98.2 F (36.8 C)  TempSrc: Temporal  SpO2: 97%  Weight: 138 lb 9.6 oz (62.9 kg)  Height: 5\' 4"  (1.626 m)  PainSc: 0-No pain   Body mass index is 23.79 kg/m.     09/06/2022    1:47 PM 09/01/2021    1:44 PM 07/01/2017   11:46 AM  Advanced Directives  Does Patient Have a Medical Advance Directive? No No No  Would patient like information on creating a medical advance directive? Yes (MAU/Ambulatory/Procedural Areas - Information given) No - Patient declined No - Patient declined    Current Medications (verified) Outpatient Encounter Medications as of 09/06/2022  Medication Sig   Cholecalciferol (VITAMIN D) 50 MCG (2000 UT) CAPS 1 po qd   finasteride (PROSCAR) 5 MG tablet Take 1 tablet by mouth once daily   vitamin B-12 (CYANOCOBALAMIN) 1000 MCG tablet Take 1 tablet (1,000 mcg total) by mouth daily.   triamcinolone ointment (KENALOG) 0.1 % Apply 1 application topically 2 (two) times daily as needed. (Patient not taking: Reported on 09/01/2021)   No facility-administered encounter medications on file as of 09/06/2022.    Allergies (verified) Patient has no known allergies.   History: Past Medical History:  Diagnosis Date   BPH (benign prostatic hyperplasia)    Elevated PSA    Past Surgical History:  Procedure Laterality Date   APPENDECTOMY     HERNIA REPAIR Bilateral 2017   Family History   Problem Relation Age of Onset   Cancer Mother 45   Dementia Father 42   Heart disease Father        CAD   Multiple sclerosis Brother    Social History   Socioeconomic History   Marital status: Married    Spouse name: Not on file   Number of children: 1   Years of education: Not on file   Highest education level: Not on file  Occupational History   Not on file  Tobacco Use   Smoking status: Never   Smokeless tobacco: Never  Vaping Use   Vaping status: Never Used  Substance and Sexual Activity   Alcohol use: No   Drug use: No   Sexual activity: Yes  Other Topics Concern   Not on file  Social History Narrative   Not on file   Social Determinants of Health   Financial Resource Strain: Low Risk  (09/01/2021)   Overall Financial Resource Strain (CARDIA)    Difficulty of Paying Living Expenses: Not hard at all  Food Insecurity: No Food Insecurity (09/06/2022)   Hunger Vital Sign    Worried About Running Out of Food in the Last Year: Never true    Ran Out of Food in the Last Year: Never true  Transportation Needs: No Transportation Needs (09/06/2022)   PRAPARE - Administrator, Civil Service (Medical): No    Lack of Transportation (Non-Medical): No  Physical Activity: Insufficiently Active (09/06/2022)  Exercise Vital Sign    Days of Exercise per Week: 3 days    Minutes of Exercise per Session: 30 min  Stress: No Stress Concern Present (09/06/2022)   [employer] of Occupational Health - Occupational Stress Questionnaire    Feeling of Stress : Not at all  Social Connections: Unknown (09/06/2022)   Social Connection and Isolation Panel [NHANES]    Frequency of Communication with Friends and Family: More than three times a week    Frequency of Social Gatherings with Friends and Family: More than three times a week    Attends Religious Services: Not on Marketing executive or Organizations: No    Attends Banker Meetings: Never     Marital Status: Married    Tobacco Counseling Counseling given: Not Answered   Clinical Intake:  Pre-visit preparation completed: Yes  Pain : No/denies pain Pain Score: 0-No pain     BMI - recorded: 23.79 Nutritional Status: BMI of 19-24  Normal Nutritional Risks: None Diabetes: No  How often do you need to have someone help you when you read instructions, pamphlets, or other written materials from your doctor or pharmacy?: 1 - Never What is the last grade level you completed in school?: College Graduate  Interpreter Needed?: No  Information entered by :: Antonio Zenon N. Cadin Luka, LPN.   Activities of Daily Living    09/06/2022    1:33 PM 09/03/2022    5:22 PM  In your present state of health, do you have any difficulty performing the following activities:  Hearing? 0 0  Vision? 0 0  Difficulty concentrating or making decisions? 0 0  Walking or climbing stairs? 0 0  Dressing or bathing? 0 0  Doing errands, shopping? 0 0  Preparing Food and eating ? N N  Using the Toilet? N N  In the past six months, have you accidently leaked urine? N N  Do you have problems with loss of bowel control? N N  Managing your Medications? N N  Managing your Finances? N N  Housekeeping or managing your Housekeeping? N N    Patient Care Team: Plotnikov, Georgina Quint, MD as PCP - General (Internal Medicine) Heloise Purpura, MD as Consulting Physician (Urology) Laren Everts Ablott, OD. as Consulting Physician (Optometry)  Indicate any recent Medical Services you may have received from other than [facility] providers in the past year (date may be approximate).     Assessment:   This is a routine wellness examination for Antonio Yu.  Hearing/Vision screen Hearing Screening - Comments:: Denies hearing difficulties   Vision Screening - Comments:: Wears reading glasses - up to date with routine eye exams with  Laren Everts Ablott, OD.   Dietary issues and exercise activities discussed:      Goals Addressed             This Visit's Progress    Patient declined health goal at this time.        Depression Screen    09/06/2022    1:32 PM 09/01/2021    1:58 PM 09/15/2020    2:05 PM 07/01/2017   11:49 AM 07/01/2017   10:37 AM  PHQ 2/9 Scores  PHQ - 2 Score 0 0 0 0 0  PHQ- 9 Score 0  0 0     Fall Risk    09/06/2022    1:32 PM 09/03/2022    5:22 PM 09/01/2021    2:01 PM 09/15/2020    2:05 PM  01/17/2019   11:02 AM  Fall Risk   Falls in the past year? 0 0 0 0 0  Comment     Emmi Telephone Survey: data to providers prior to load  Number falls in past yr: 0 0 0 0   Injury with Fall? 0 0 0 0   Risk for fall due to : No Fall Risks  No Fall Risks No Fall Risks   Follow up Falls prevention discussed  Falls evaluation completed      MEDICARE RISK AT HOME:  Medicare Risk at Home - 09/06/22 1333     Any stairs in or around the home? No    If so, are there any without handrails? No    Home free of loose throw rugs in walkways, pet beds, electrical cords, etc? Yes    Adequate lighting in your home to reduce risk of falls? Yes    Life alert? No    Use of a cane, walker or w/c? No    Grab bars in the bathroom? Yes    Shower chair or bench in shower? No    Elevated toilet seat or a handicapped toilet? No             TIMED UP AND GO:  Was the test performed?  Yes  Length of time to ambulate 10 feet: 8 sec Gait steady and fast without use of assistive device    Cognitive Function:        09/06/2022    1:33 PM 09/01/2021    2:02 PM  6CIT Screen  What Year? 0 points 0 points  What month? 0 points 0 points  What time? 0 points 0 points  Count back from 20 0 points 0 points  Months in reverse 0 points 0 points  Repeat phrase 0 points 0 points  Total Score 0 points 0 points    Immunizations Immunization History  Administered Date(s) Administered   MMR 05/11/2014   PFIZER(Purple Top)SARS-COV-2 Vaccination 05/08/2019, 05/29/2019   Pneumococcal Conjugate-13  12/25/2019   Pneumococcal Polysaccharide-23 11/16/2018   Tdap 01/11/2012    TDAP status: Due, Education has been provided regarding the importance of this vaccine. Advised may receive this vaccine at local pharmacy or Health Dept. Aware to provide a copy of the vaccination record if obtained from local pharmacy or Health Dept. Verbalized acceptance and understanding.  Flu Vaccine status: Declined, Education has been provided regarding the importance of this vaccine but patient still declined. Advised may receive this vaccine at local pharmacy or Health Dept. Aware to provide a copy of the vaccination record if obtained from local pharmacy or Health Dept. Verbalized acceptance and understanding.  Pneumococcal vaccine status: Up to date  Covid-19 vaccine status: Completed vaccines  Qualifies for Shingles Vaccine? Yes   Zostavax completed No   Shingrix Completed?: No.    Education has been provided regarding the importance of this vaccine. Patient has been advised to call insurance company to determine out of pocket expense if they have not yet received this vaccine. Advised may also receive vaccine at local pharmacy or Health Dept. Verbalized acceptance and understanding.  Screening Tests Health Maintenance  Topic Date Due   COVID-19 Vaccine (3 - 2023-24 season) 10/23/2021   DTaP/Tdap/Td (2 - Td or Tdap) 01/10/2022   INFLUENZA VACCINE  09/23/2022   Colonoscopy  05/22/2023   Medicare Annual Wellness (AWV)  09/06/2023   Pneumonia Vaccine 59+ Years old  Completed   Hepatitis C Screening  Completed  HPV VACCINES  Aged Out   Zoster Vaccines- Shingrix  Discontinued    Health Maintenance  Health Maintenance Due  Topic Date Due   COVID-19 Vaccine (3 - 2023-24 season) 10/23/2021   DTaP/Tdap/Td (2 - Td or Tdap) 01/10/2022    Colorectal cancer screening: Type of screening: Colonoscopy. Completed 05/21/2013. Repeat every 10 years  Lung Cancer Screening: (Low Dose CT Chest recommended if  Age 76-80 years, 20 pack-year currently smoking OR have quit w/in 15years.) does not qualify.   Lung Cancer Screening Referral: no  Additional Screening:  Hepatitis C Screening: does qualify; Completed 05/28/2015  Vision Screening: Recommended annual ophthalmology exams for early detection of glaucoma and other disorders of the eye. Is the patient up to date with their annual eye exam?  Yes  Who is the provider or what is the name of the office in which the patient attends annual eye exams? London Sheer, OD. If pt is not established with a provider, would they like to be referred to a provider to establish care? No .   Dental Screening: Recommended annual dental exams for proper oral hygiene  Diabetic Foot Exam: N/A  Community Resource Referral / Chronic Care Management: CRR required this visit?  No   CCM required this visit?  No     Plan:     I have personally reviewed and noted the following in the patient's chart:   Medical and social history Use of alcohol, tobacco or illicit drugs  Current medications and supplements including opioid prescriptions. Patient is not currently taking opioid prescriptions. Functional ability and status Nutritional status Physical activity Advanced directives List of other physicians Hospitalizations, surgeries, and ER visits in previous 12 months Vitals Screenings to include cognitive, depression, and falls Referrals and appointments  In addition, I have reviewed and discussed with patient certain preventive protocols, quality metrics, and best practice recommendations. A written personalized care plan for preventive services as well as general preventive health recommendations were provided to patient.     Mickeal Needy, LPN   1/61/0960   After Visit Summary: Handout printed and given to patient to review.    Nurse Notes: Normal cognitive status assessed by direct observation by this Nurse Health Advisor. No abnormalities found.     Medical screening examination/treatment/procedure(s) were performed by non-physician practitioner and as supervising physician I was immediately available for consultation/collaboration.  I agree with above. Jacinta Shoe, MD

## 2022-09-06 NOTE — Patient Instructions (Addendum)
Antonio Yu , Thank you for taking time to come for your Medicare Wellness Visit. I appreciate your ongoing commitment to your health goals. Please review the following plan we discussed and let me know if I can assist you in the future.   These are the goals we discussed:  Goals      Patient declined health goal at this time.        This is a list of the screening recommended for you and due dates:  Health Maintenance  Topic Date Due   COVID-19 Vaccine (3 - 2023-24 season) 10/23/2021   DTaP/Tdap/Td vaccine (2 - Td or Tdap) 01/10/2022   Flu Shot  09/23/2022   Colon Cancer Screening  05/22/2023   Medicare Annual Wellness Visit  09/06/2023   Pneumonia Vaccine  Completed   Hepatitis C Screening  Completed   HPV Vaccine  Aged Out   Zoster (Shingles) Vaccine  Discontinued    Advanced directives: Advance directive discussed with you today. I have provided a copy for you to complete at home and have notarized. Once this is complete please bring a copy in to our office so we can scan it into your chart.  Conditions/risks identified: Yes  Next appointment: Follow up in one year for your annual wellness visit.   Preventive Care 72 Years and Older, Male  Preventive care refers to lifestyle choices and visits with your health care provider that can promote health and wellness. What does preventive care include? A yearly physical exam. This is also called an annual well check. Dental exams once or twice a year. Routine eye exams. Ask your health care provider how often you should have your eyes checked. Personal lifestyle choices, including: Daily care of your teeth and gums. Regular physical activity. Eating a healthy diet. Avoiding tobacco and drug use. Limiting alcohol use. Practicing safe sex. Taking low doses of aspirin every day. Taking vitamin and mineral supplements as recommended by your health care provider. What happens during an annual well check? The services and  screenings done by your health care provider during your annual well check will depend on your age, overall health, lifestyle risk factors, and family history of disease. Counseling  Your health care provider may ask you questions about your: Alcohol use. Tobacco use. Drug use. Emotional well-being. Home and relationship well-being. Sexual activity. Eating habits. History of falls. Memory and ability to understand (cognition). Work and work Astronomer. Screening  You may have the following tests or measurements: Height, weight, and BMI. Blood pressure. Lipid and cholesterol levels. These may be checked every 5 years, or more frequently if you are over 26 years old. Skin check. Lung cancer screening. You may have this screening every year starting at age 53 if you have a 30-pack-year history of smoking and currently smoke or have quit within the past 15 years. Fecal occult blood test (FOBT) of the stool. You may have this test every year starting at age 24. Flexible sigmoidoscopy or colonoscopy. You may have a sigmoidoscopy every 5 years or a colonoscopy every 10 years starting at age 23. Prostate cancer screening. Recommendations will vary depending on your family history and other risks. Hepatitis C blood test. Hepatitis B blood test. Sexually transmitted disease (STD) testing. Diabetes screening. This is done by checking your blood sugar (glucose) after you have not eaten for a while (fasting). You may have this done every 1-3 years. Abdominal aortic aneurysm (AAA) screening. You may need this if you are a current or  former smoker. Osteoporosis. You may be screened starting at age 36 if you are at high risk. Talk with your health care provider about your test results, treatment options, and if necessary, the need for more tests. Vaccines  Your health care provider may recommend certain vaccines, such as: Influenza vaccine. This is recommended every year. Tetanus, diphtheria, and  acellular pertussis (Tdap, Td) vaccine. You may need a Td booster every 10 years. Zoster vaccine. You may need this after age 67. Pneumococcal 13-valent conjugate (PCV13) vaccine. One dose is recommended after age 89. Pneumococcal polysaccharide (PPSV23) vaccine. One dose is recommended after age 53. Talk to your health care provider about which screenings and vaccines you need and how often you need them. This information is not intended to replace advice given to you by your health care provider. Make sure you discuss any questions you have with your health care provider. Document Released: 03/07/2015 Document Revised: 10/29/2015 Document Reviewed: 12/10/2014 Elsevier Interactive Patient Education  2017 [employer].  Fall Prevention in the Home Falls can cause injuries. They can happen to people of all ages. There are many things you can do to make your home safe and to help prevent falls. What can I do on the outside of my home? Regularly fix the edges of walkways and driveways and fix any cracks. Remove anything that might make you trip as you walk through a door, such as a raised step or threshold. Trim any bushes or trees on the path to your home. Use bright outdoor lighting. Clear any walking paths of anything that might make someone trip, such as rocks or tools. Regularly check to see if handrails are loose or broken. Make sure that both sides of any steps have handrails. Any raised decks and porches should have guardrails on the edges. Have any leaves, snow, or ice cleared regularly. Use sand or salt on walking paths during winter. Clean up any spills in your garage right away. This includes oil or grease spills. What can I do in the bathroom? Use night lights. Install grab bars by the toilet and in the tub and shower. Do not use towel bars as grab bars. Use non-skid mats or decals in the tub or shower. If you need to sit down in the shower, use a plastic, non-slip stool. Keep  the floor dry. Clean up any water that spills on the floor as soon as it happens. Remove soap buildup in the tub or shower regularly. Attach bath mats securely with double-sided non-slip rug tape. Do not have throw rugs and other things on the floor that can make you trip. What can I do in the bedroom? Use night lights. Make sure that you have a light by your bed that is easy to reach. Do not use any sheets or blankets that are too big for your bed. They should not hang down onto the floor. Have a firm chair that has side arms. You can use this for support while you get dressed. Do not have throw rugs and other things on the floor that can make you trip. What can I do in the kitchen? Clean up any spills right away. Avoid walking on wet floors. Keep items that you use a lot in easy-to-reach places. If you need to reach something above you, use a strong step stool that has a grab bar. Keep electrical cords out of the way. Do not use floor polish or wax that makes floors slippery. If you must use wax, use  non-skid floor wax. Do not have throw rugs and other things on the floor that can make you trip. What can I do with my stairs? Do not leave any items on the stairs. Make sure that there are handrails on both sides of the stairs and use them. Fix handrails that are broken or loose. Make sure that handrails are as long as the stairways. Check any carpeting to make sure that it is firmly attached to the stairs. Fix any carpet that is loose or worn. Avoid having throw rugs at the top or bottom of the stairs. If you do have throw rugs, attach them to the floor with carpet tape. Make sure that you have a light switch at the top of the stairs and the bottom of the stairs. If you do not have them, ask someone to add them for you. What else can I do to help prevent falls? Wear shoes that: Do not have high heels. Have rubber bottoms. Are comfortable and fit you well. Are closed at the toe. Do not  wear sandals. If you use a stepladder: Make sure that it is fully opened. Do not climb a closed stepladder. Make sure that both sides of the stepladder are locked into place. Ask someone to hold it for you, if possible. Clearly mark and make sure that you can see: Any grab bars or handrails. First and last steps. Where the edge of each step is. Use tools that help you move around (mobility aids) if they are needed. These include: Canes. Walkers. Scooters. Crutches. Turn on the lights when you go into a dark area. Replace any light bulbs as soon as they burn out. Set up your furniture so you have a clear path. Avoid moving your furniture around. If any of your floors are uneven, fix them. If there are any pets around you, be aware of where they are. Review your medicines with your doctor. Some medicines can make you feel dizzy. This can increase your chance of falling. Ask your doctor what other things that you can do to help prevent falls. This information is not intended to replace advice given to you by your health care provider. Make sure you discuss any questions you have with your health care provider. Document Released: 12/05/2008 Document Revised: 07/17/2015 Document Reviewed: 03/15/2014 Elsevier Interactive Patient Education  2017 [employer].

## 2022-09-06 NOTE — Assessment & Plan Note (Signed)
We discussed age appropriate health related issues, including available/recomended screening tests and vaccinations. Labs were ordered to be later reviewed . All questions were answered. We discussed one or more of the following - seat belt use, use of sunscreen/sun exposure exercise, fall risk reduction, second hand smoke exposure, firearm use and storage, seat belt use, a need for adhering to healthy diet and exercise. Labs were discussed. All questions were answered. A  cardiac CT scan for calcium scoring test offered - declined 2021-22 Rectal - per Urology q 6 months Colon 2015 due in 2025 Hep C(-)

## 2022-09-06 NOTE — Assessment & Plan Note (Signed)
 On Vit D 

## 2022-09-06 NOTE — Assessment & Plan Note (Signed)
PSA w/Urology 

## 2022-09-06 NOTE — Progress Notes (Signed)
Subjective:  Patient ID: Antonio Yu, male    DOB: [birth year]  Age: 72 y.o. MRN: [id number]  CC: Annual Exam   HPI Antonio Yu presents for a well exam  Outpatient Medications Prior to Visit  Medication Sig Dispense Refill   Cholecalciferol (VITAMIN D) 50 MCG (2000 UT) CAPS 1 po qd 100 capsule 3   finasteride (PROSCAR) 5 MG tablet Take 1 tablet by mouth once daily 90 tablet 3   vitamin B-12 (CYANOCOBALAMIN) 1000 MCG tablet Take 1 tablet (1,000 mcg total) by mouth daily. 100 tablet 3   triamcinolone ointment (KENALOG) 0.1 % Apply 1 application topically 2 (two) times daily as needed. (Patient not taking: Reported on 09/01/2021) 80 g 2   No facility-administered medications prior to visit.    ROS: Review of Systems  Constitutional:  Negative for appetite change, fatigue and unexpected weight change.  HENT:  Negative for congestion, nosebleeds, sneezing, sore throat and trouble swallowing.   Eyes:  Negative for itching and visual disturbance.  Respiratory:  Negative for cough.   Cardiovascular:  Negative for chest pain, palpitations and leg swelling.  Gastrointestinal:  Negative for abdominal distention, blood in stool, diarrhea and nausea.  Genitourinary:  Negative for frequency and hematuria.  Musculoskeletal:  Negative for back pain, gait problem, joint swelling and neck pain.  Skin:  Negative for rash.  Neurological:  Negative for dizziness, tremors, speech difficulty and weakness.  Psychiatric/Behavioral:  Negative for agitation, dysphoric mood and sleep disturbance. The patient is not nervous/anxious.     Objective:  BP 118/78 (BP Location: Left Arm, Patient Position: Sitting, Cuff Size: Normal)   Pulse 68   Temp 98.2 F (36.8 C) (Oral)   Ht 5\' 4"  (1.626 m)   Wt 138 lb (62.6 kg)   SpO2 97%   BMI 23.69 kg/m   BP Readings from Last 3 Encounters:  09/06/22 118/78  09/06/22 118/78  09/01/21 104/70    Wt Readings from Last 3 Encounters:  09/06/22 138  lb (62.6 kg)  09/06/22 138 lb 9.6 oz (62.9 kg)  09/01/21 137 lb 8 oz (62.4 kg)    Physical Exam Constitutional:      General: He is not in acute distress.    Appearance: He is well-developed.     Comments: NAD  Eyes:     Conjunctiva/sclera: Conjunctivae normal.     Pupils: Pupils are equal, round, and reactive to light.  Neck:     Thyroid: No thyromegaly.     Vascular: No JVD.  Cardiovascular:     Rate and Rhythm: Normal rate and regular rhythm.     Heart sounds: Normal heart sounds. No murmur heard.    No friction rub. No gallop.  Pulmonary:     Effort: Pulmonary effort is normal. No respiratory distress.     Breath sounds: Normal breath sounds. No wheezing or rales.  Chest:     Chest wall: No tenderness.  Abdominal:     General: Bowel sounds are normal. There is no distension.     Palpations: Abdomen is soft. There is no mass.     Tenderness: There is no abdominal tenderness. There is no guarding or rebound.  Musculoskeletal:        General: No tenderness. Normal range of motion.     Cervical back: Normal range of motion.  Lymphadenopathy:     Cervical: No cervical adenopathy.  Skin:    General: Skin is warm and dry.     Findings: No rash.  Neurological:  Mental Status: He is alert.     Cranial Nerves: No cranial nerve deficit.     Motor: No abnormal muscle tone.     Coordination: Coordination normal.     Gait: Gait normal.     Deep Tendon Reflexes: Reflexes are normal and symmetric.  Psychiatric:        Behavior: Behavior normal.        Thought Content: Thought content normal.        Judgment: Judgment normal.   Rectal - per Urology  Lab Results  Component Value Date   WBC 5.0 09/01/2021   HGB 16.5 09/01/2021   HCT 49.6 09/01/2021   PLT 168.0 09/01/2021   GLUCOSE 92 09/01/2021   CHOL 243 (H) 09/01/2021   TRIG 168.0 (H) 09/01/2021   HDL 55.80 09/01/2021   LDLDIRECT 153.0 12/25/2019   LDLCALC 154 (H) 09/01/2021   ALT 14 09/01/2021   AST 14  09/01/2021   NA 136 09/01/2021   K 4.6 09/01/2021   CL 99 09/01/2021   CREATININE 1.08 09/01/2021   BUN 13 09/01/2021   CO2 30 09/01/2021   TSH 2.35 09/01/2021   PSA 9.98 (H) 09/01/2021    MR PROSTATE W WO CONTRAST  Result Date: 01/31/2018 CLINICAL DATA:  Prostate cancer, Gleason 3+3=6 adenocarcinoma the left lateral base. Continued elevation in PSA level. EXAM: MR PROSTATE WITHOUT AND WITH CONTRAST TECHNIQUE: Multiplanar multisequence MRI images were obtained of the pelvis centered about the prostate. Pre and post contrast images were obtained. CONTRAST:  14mL MULTIHANCE GADOBENATE DIMEGLUMINE 529 MG/ML IV SOLN Creatinine was obtained on site at [facility] at [address] Results: Creatinine 1.2 mg/dL. COMPARISON:  Prostate MRI from 11/08/2016 FINDINGS: Prostate: Overall prominent transition zone/central gland with various nodules most of which are T2 hyperintense. The several T2 hypointense nodules are relatively well-defined and not associated with significantly activity on high B value diffusion-weighted images. A small PI-RADS category 3 lesion in the right posterolateral peripheral zone apex has a volume of 0.1 cubic cm and measures 0.7 by 0.2 by 0.5 cm. No appreciable abnormal early enhancement or abnormal activity on diffusion-weighted images. There is some linear bandlike PI-RADS category 2 appearance of the left prostate apex for example on images 15 through 17 of series 7, without other worrisome characteristics. Volume: 3D volumetric analysis: 75.14 cubic cm (6.0 by 5.1 by 5.3 cm). Transcapsular spread:  Absent Seminal vesicle involvement: Absent Neurovascular bundle involvement: Absent Pelvic adenopathy: Absent Bone metastasis: Absent Other findings: No supplemental non-categorized findings. IMPRESSION: 1. Small PI-RADS category 3 lesion in the right posterolateral apex peripheral zone. There is some minimal PI-RADS category 2 bandlike signal in the left prostate apex.  Appropriate targeting data for the PI-RADS category 3 lesion was sent to [facility]. 2. Prostate volume: 75.14 cubic cm. 3. Benign prostatic hypertrophy. Electronically Signed   By: Gaylyn Rong M.D.   On: 01/31/2018 14:46    Assessment & Plan:   Problem List Items Addressed This Visit     Well adult exam - Primary    We discussed age appropriate health related issues, including available/recomended screening tests and vaccinations. Labs were ordered to be later reviewed . All questions were answered. We discussed one or more of the following - seat belt use, use of sunscreen/sun exposure exercise, fall risk reduction, second hand smoke exposure, firearm use and storage, seat belt use, a need for adhering to healthy diet and exercise. Labs were discussed. All questions were answered. A  cardiac CT  scan for calcium scoring test offered - declined 2021-22 Rectal - per Urology q 6 months Colon 2015 due in 2025 Hep C(-)      Relevant Orders   TSH   CBC with Differential/Platelet   Urinalysis   Lipid panel   Hepatic function panel   Comprehensive metabolic panel   Vitamin D deficiency    On Vit D      Bladder neck obstruction    Dr Laverle Patter q 6 mo      Elevated PSA    PSA w/Urology         No orders of the defined types were placed in this encounter.     Follow-up: Return in about 1 year (around 09/06/2023) for Wellness Exam.  Sonda Primes, MD

## 2022-09-06 NOTE — Assessment & Plan Note (Signed)
Dr Laverle Patter q 6 mo

## 2022-09-07 LAB — COMPREHENSIVE METABOLIC PANEL
ALT: 18 U/L (ref 0–53)
AST: 19 U/L (ref 0–37)
Albumin: 3.9 g/dL (ref 3.5–5.2)
Alkaline Phosphatase: 72 U/L (ref 39–117)
BUN: 14 mg/dL (ref 6–23)
CO2: 29 mEq/L (ref 19–32)
Calcium: 9.2 mg/dL (ref 8.4–10.5)
Chloride: 104 mEq/L (ref 96–112)
Creatinine, Ser: 1.05 mg/dL (ref 0.40–1.50)
GFR: 70.92 mL/min (ref >60.00)
Glucose, Bld: 105 mg/dL — ABNORMAL HIGH (ref 70–99)
Potassium: 4.7 mEq/L (ref 3.5–5.1)
Sodium: 139 mEq/L (ref 135–145)
Total Bilirubin: 0.8 mg/dL (ref 0.2–1.2)
Total Protein: 6.3 g/dL (ref 6.0–8.3)

## 2022-09-07 LAB — LIPID PANEL
Cholesterol: 210 mg/dL — ABNORMAL HIGH (ref 0–200)
HDL: 51.7 mg/dL (ref >39.00)
LDL Cholesterol: 128 mg/dL — ABNORMAL HIGH (ref 0–99)
NonHDL: 158.39
Total CHOL/HDL Ratio: 4
Triglycerides: 150 mg/dL — ABNORMAL HIGH (ref 0.0–149.0)
VLDL: 30 mg/dL (ref 0.0–40.0)

## 2022-09-07 LAB — HEPATIC FUNCTION PANEL
ALT: 18 U/L (ref 0–53)
AST: 19 U/L (ref 0–37)
Albumin: 3.9 g/dL (ref 3.5–5.2)
Alkaline Phosphatase: 72 U/L (ref 39–117)
Bilirubin, Direct: 0.1 mg/dL (ref 0.0–0.3)
Total Bilirubin: 0.8 mg/dL (ref 0.2–1.2)
Total Protein: 6.3 g/dL (ref 6.0–8.3)

## 2022-09-07 LAB — URINALYSIS
Bilirubin Urine: NEGATIVE
Hgb urine dipstick: NEGATIVE
Ketones, ur: NEGATIVE
Leukocytes,Ua: NEGATIVE
Nitrite: NEGATIVE
Specific Gravity, Urine: 1.02 (ref 1.000–1.030)
Total Protein, Urine: NEGATIVE
Urine Glucose: NEGATIVE
Urobilinogen, UA: 0.2 (ref 0.0–1.0)
pH: 6.5 (ref 5.0–8.0)

## 2022-09-25 ENCOUNTER — Encounter: Payer: Self-pay | Admitting: Internal Medicine

## 2022-09-25 ENCOUNTER — Other Ambulatory Visit: Payer: Self-pay | Admitting: Internal Medicine

## 2022-12-01 ENCOUNTER — Encounter: Payer: Self-pay | Admitting: Internal Medicine

## 2022-12-01 ENCOUNTER — Ambulatory Visit (INDEPENDENT_AMBULATORY_CARE_PROVIDER_SITE_OTHER): Payer: PPO | Admitting: Internal Medicine

## 2022-12-01 VITALS — BP 140/88 | HR 99 | Temp 99.1°F | Ht 64.0 in | Wt 136.4 lb

## 2022-12-01 DIAGNOSIS — J309 Allergic rhinitis, unspecified: Secondary | ICD-10-CM

## 2022-12-01 DIAGNOSIS — H6992 Unspecified Eustachian tube disorder, left ear: Secondary | ICD-10-CM | POA: Diagnosis not present

## 2022-12-01 DIAGNOSIS — R030 Elevated blood-pressure reading, without diagnosis of hypertension: Secondary | ICD-10-CM | POA: Diagnosis not present

## 2022-12-01 DIAGNOSIS — E559 Vitamin D deficiency, unspecified: Secondary | ICD-10-CM | POA: Diagnosis not present

## 2022-12-01 DIAGNOSIS — J069 Acute upper respiratory infection, unspecified: Secondary | ICD-10-CM

## 2022-12-01 MED ORDER — DOXYCYCLINE HYCLATE 100 MG PO TABS: 100.0000 mg | ORAL_TABLET | Freq: Two times a day (BID) | ORAL | 0 refills | Status: DC

## 2022-12-01 MED ORDER — PREDNISONE 10 MG PO TABS: ORAL_TABLET | ORAL | 0 refills | Status: DC

## 2022-12-01 NOTE — Patient Instructions (Signed)
Please take all new medication as prescribed - the antibiotic, and prednisone,  You can also take Delsym OTC for cough, and/or Mucinex (or it's generic off brand) for congestion, and tylenol as needed for pain.  Please continue all other medications as before, and refills have been done if requested.  Please have the pharmacy call with any other refills you may need.  Please keep your appointments with your specialists as you may have planned

## 2022-12-01 NOTE — Progress Notes (Signed)
Patient ID: Antonio Yu, male   DOB: 1951-01-03, 72 y.o.   MRN: [id number]        Chief Complaint: follow up URI, allergies, left eustachian tube dysfxn, htn,, low vit d       HPI:  Antonio Yu is a 72 y.o. male  Here with 2-3 days acute onset fever, facial pain, pressure, headache, general weakness and malaise, and greenish d/c, with mild ST and cough, but pt denies chest pain, wheezing, increased sob or doe, orthopnea, PND, increased LE swelling, palpitations, dizziness or syncope.  Does have several wks ongoing nasal allergy symptoms with clearish congestion, itch and sneezing, without fever, pain, ST, cough, swelling or wheezing.  Also has left ear popping and crackling with muffled hearing at times.         Wt Readings from Last 3 Encounters:  12/01/22 136 lb 6.4 oz (61.9 kg)  09/06/22 138 lb (62.6 kg)  09/06/22 138 lb 9.6 oz (62.9 kg)   BP Readings from Last 3 Encounters:  12/01/22 (!) 140/88  09/06/22 118/78  09/06/22 118/78         Past Medical History:  Diagnosis Date   BPH (benign prostatic hyperplasia)    Elevated PSA    Past Surgical History:  Procedure Laterality Date   APPENDECTOMY     HERNIA REPAIR Bilateral 2017    reports that he has never smoked. He has never used smokeless tobacco. He reports that he does not drink alcohol and does not use drugs. family history includes Cancer (age of onset: 56) in his mother; Dementia (age of onset: 72) in his father; Heart disease in his father; Multiple sclerosis in his brother. No Known Allergies Current Outpatient Medications on File Prior to Visit  Medication Sig Dispense Refill   Cholecalciferol (VITAMIN D) 50 MCG (2000 UT) CAPS 1 po qd 100 capsule 3   finasteride (PROSCAR) 5 MG tablet Take 1 tablet by mouth once daily 90 tablet 3   vitamin B-12 (CYANOCOBALAMIN) 1000 MCG tablet Take 1 tablet (1,000 mcg total) by mouth daily. 100 tablet 3   triamcinolone ointment (KENALOG) 0.1 % Apply 1 application topically  2 (two) times daily as needed. (Patient not taking: Reported on 09/01/2021) 80 g 2   No current facility-administered medications on file prior to visit.        ROS:  All others reviewed and negative.  Objective        PE:  BP (!) 140/88 (BP Location: Left Arm, Patient Position: Sitting, Cuff Size: Normal)   Pulse 99   Temp 99.1 F (37.3 C) (Oral)   Ht 5\' 4"  (1.626 m)   Wt 136 lb 6.4 oz (61.9 kg)   SpO2 97%   BMI 23.41 kg/m                 Constitutional: Pt appears in NAD               HENT: Head: NCAT.                Right Ear: External ear normal.                 Left Ear: External ear normal. Bilat tm's with mild erythema.  Max sinus areas mild tender.  Pharynx with mild erythema, no exudate               Eyes: . Pupils are equal, round, and reactive to light. Conjunctivae and EOM are normal  Nose: without d/c or deformity               Neck: Neck supple. Gross normal ROM               Cardiovascular: Normal rate and regular rhythm.                 Pulmonary/Chest: Effort normal and breath sounds without rales or wheezing.                Abd:  Soft, NT, ND, + BS, no organomegaly               Neurological: Pt is alert. At baseline orientation, motor grossly intact               Skin: Skin is warm. No rashes, no other new lesions, LE edema - none               Psychiatric: Pt behavior is normal without agitation   Micro: none  Cardiac tracings I have personally interpreted today:  none  Pertinent Radiological findings (summarize): none   Lab Results  Component Value Date   WBC 4.5 09/06/2022   HGB 15.9 09/06/2022   HCT 48.4 09/06/2022   PLT 152.0 09/06/2022   GLUCOSE 105 (H) 09/06/2022   CHOL 210 (H) 09/06/2022   TRIG 150.0 (H) 09/06/2022   HDL 51.70 09/06/2022   LDLDIRECT 153.0 12/25/2019   LDLCALC 128 (H) 09/06/2022   ALT 18 09/06/2022   ALT 18 09/06/2022   AST 19 09/06/2022   AST 19 09/06/2022   NA 139 09/06/2022   K 4.7 09/06/2022   CL 104  09/06/2022   CREATININE 1.05 09/06/2022   BUN 14 09/06/2022   CO2 29 09/06/2022   TSH 2.21 09/06/2022   PSA 9.98 (H) 09/01/2021   Assessment/Plan:  Antonio Yu is a 72 y.o. Other or two or more races [6] male with  has a past medical history of BPH (benign prostatic hyperplasia) and Elevated PSA.  Acute upper respiratory infection Mild to mod, for antibx course - doxycycline 100 bid,  to f/u any worsening symptoms or concerns  Allergic rhinitis Mild to mod, for prednisone taper,,  to f/u any worsening symptoms or concerns  Acute dysfunction of left eustachian tube Mild to mod, for mucinex bid prn  to f/u any worsening symptoms or concerns  Vitamin D deficiency Last vitamin D Lab Results  Component Value Date   VD25OH 31.27 11/16/2018   Low, to start oral replacement   Elevated blood pressure, situational Mild to mod, likely situationl, cont to monitor at home and next visti, to f/u any worsening symptoms or concerns Followup: Return if symptoms worsen or fail to improve.  Oliver Barre, MD 12/04/2022 6:33 PM [facility]

## 2022-12-04 ENCOUNTER — Encounter: Payer: Self-pay | Admitting: Internal Medicine

## 2022-12-04 DIAGNOSIS — J309 Allergic rhinitis, unspecified: Secondary | ICD-10-CM | POA: Insufficient documentation

## 2022-12-04 DIAGNOSIS — R030 Elevated blood-pressure reading, without diagnosis of hypertension: Secondary | ICD-10-CM | POA: Insufficient documentation

## 2022-12-04 DIAGNOSIS — J069 Acute upper respiratory infection, unspecified: Secondary | ICD-10-CM | POA: Insufficient documentation

## 2022-12-04 DIAGNOSIS — H6992 Unspecified Eustachian tube disorder, left ear: Secondary | ICD-10-CM | POA: Insufficient documentation

## 2022-12-04 NOTE — Assessment & Plan Note (Signed)
Mild to mod, for mucinex bid prn  to f/u any worsening symptoms or concerns

## 2022-12-04 NOTE — Assessment & Plan Note (Signed)
Last vitamin D Lab Results  Component Value Date   VD25OH 31.27 11/16/2018   Low, to start oral replacement

## 2022-12-04 NOTE — Assessment & Plan Note (Signed)
Mild to mod, for prednisone taper,,  to f/u any worsening symptoms or concerns

## 2022-12-04 NOTE — Assessment & Plan Note (Signed)
Mild to mod, likely situationl, cont to monitor at home and next visti, to f/u any worsening symptoms or concerns

## 2022-12-04 NOTE — Assessment & Plan Note (Signed)
Mild to mod, for antibx course doxycycline 100 bid,  to f/u any worsening symptoms or concerns

## 2023-01-13 DIAGNOSIS — H40053 Ocular hypertension, bilateral: Secondary | ICD-10-CM | POA: Diagnosis not present

## 2023-01-13 DIAGNOSIS — H52222 Regular astigmatism, left eye: Secondary | ICD-10-CM | POA: Diagnosis not present

## 2023-01-13 DIAGNOSIS — H5203 Hypermetropia, bilateral: Secondary | ICD-10-CM | POA: Diagnosis not present

## 2023-01-13 DIAGNOSIS — H524 Presbyopia: Secondary | ICD-10-CM | POA: Diagnosis not present

## 2023-01-13 DIAGNOSIS — H2513 Age-related nuclear cataract, bilateral: Secondary | ICD-10-CM | POA: Diagnosis not present

## 2023-01-26 DIAGNOSIS — C61 Malignant neoplasm of prostate: Secondary | ICD-10-CM | POA: Diagnosis not present

## 2023-02-02 DIAGNOSIS — R351 Nocturia: Secondary | ICD-10-CM | POA: Diagnosis not present

## 2023-02-02 DIAGNOSIS — N401 Enlarged prostate with lower urinary tract symptoms: Secondary | ICD-10-CM | POA: Diagnosis not present

## 2023-02-02 DIAGNOSIS — C61 Malignant neoplasm of prostate: Secondary | ICD-10-CM | POA: Diagnosis not present

## 2023-02-03 ENCOUNTER — Other Ambulatory Visit: Payer: Self-pay | Admitting: Urology

## 2023-02-03 DIAGNOSIS — C61 Malignant neoplasm of prostate: Secondary | ICD-10-CM

## 2023-07-21 DIAGNOSIS — H40053 Ocular hypertension, bilateral: Secondary | ICD-10-CM | POA: Diagnosis not present

## 2023-07-21 DIAGNOSIS — H524 Presbyopia: Secondary | ICD-10-CM | POA: Diagnosis not present

## 2023-07-21 DIAGNOSIS — H52223 Regular astigmatism, bilateral: Secondary | ICD-10-CM | POA: Diagnosis not present

## 2023-07-21 DIAGNOSIS — H5203 Hypermetropia, bilateral: Secondary | ICD-10-CM | POA: Diagnosis not present

## 2023-07-21 DIAGNOSIS — H2513 Age-related nuclear cataract, bilateral: Secondary | ICD-10-CM | POA: Diagnosis not present

## 2023-07-21 DIAGNOSIS — H35372 Puckering of macula, left eye: Secondary | ICD-10-CM | POA: Diagnosis not present

## 2023-07-26 ENCOUNTER — Ambulatory Visit

## 2023-08-04 ENCOUNTER — Encounter: Payer: Self-pay | Admitting: Internal Medicine

## 2023-08-04 ENCOUNTER — Ambulatory Visit (INDEPENDENT_AMBULATORY_CARE_PROVIDER_SITE_OTHER): Admitting: Internal Medicine

## 2023-08-04 VITALS — BP 116/72 | HR 75 | Temp 97.8°F | Ht 64.0 in | Wt 147.0 lb

## 2023-08-04 DIAGNOSIS — R972 Elevated prostate specific antigen [PSA]: Secondary | ICD-10-CM

## 2023-08-04 DIAGNOSIS — Z0000 Encounter for general adult medical examination without abnormal findings: Secondary | ICD-10-CM

## 2023-08-04 DIAGNOSIS — E559 Vitamin D deficiency, unspecified: Secondary | ICD-10-CM | POA: Diagnosis not present

## 2023-08-04 DIAGNOSIS — Z1211 Encounter for screening for malignant neoplasm of colon: Secondary | ICD-10-CM

## 2023-08-04 LAB — CBC WITH DIFFERENTIAL/PLATELET
Basophils Absolute: 0.1 10*3/uL (ref 0.0–0.1)
Basophils Relative: 1.2 % (ref 0.0–3.0)
Eosinophils Absolute: 0.1 10*3/uL (ref 0.0–0.7)
Eosinophils Relative: 1.8 % (ref 0.0–5.0)
HCT: 46.4 % (ref 39.0–52.0)
Hemoglobin: 15.3 g/dL (ref 13.0–17.0)
Lymphocytes Relative: 29.2 % (ref 12.0–46.0)
Lymphs Abs: 1.4 10*3/uL (ref 0.7–4.0)
MCHC: 33 g/dL (ref 30.0–36.0)
MCV: 83.7 fl (ref 78.0–100.0)
Monocytes Absolute: 0.4 10*3/uL (ref 0.1–1.0)
Monocytes Relative: 7.7 % (ref 3.0–12.0)
Neutro Abs: 3 10*3/uL (ref 1.4–7.7)
Neutrophils Relative %: 60.1 % (ref 43.0–77.0)
Platelets: 156 10*3/uL (ref 150.0–400.0)
RBC: 5.55 Mil/uL (ref 4.22–5.81)
RDW: 15.9 % — ABNORMAL HIGH (ref 11.5–15.5)
WBC: 4.9 10*3/uL (ref 4.0–10.5)

## 2023-08-04 LAB — COMPREHENSIVE METABOLIC PANEL WITH GFR
ALT: 19 U/L (ref 0–53)
AST: 15 U/L (ref 0–37)
Albumin: 4.1 g/dL (ref 3.5–5.2)
Alkaline Phosphatase: 78 U/L (ref 39–117)
BUN: 15 mg/dL (ref 6–23)
CO2: 27 meq/L (ref 19–32)
Calcium: 8.7 mg/dL (ref 8.4–10.5)
Chloride: 103 meq/L (ref 96–112)
Creatinine, Ser: 1.01 mg/dL (ref 0.40–1.50)
GFR: 73.83 mL/min (ref >60.00)
Glucose, Bld: 115 mg/dL — ABNORMAL HIGH (ref 70–99)
Potassium: 3.9 meq/L (ref 3.5–5.1)
Sodium: 139 meq/L (ref 135–145)
Total Bilirubin: 0.9 mg/dL (ref 0.2–1.2)
Total Protein: 6.3 g/dL (ref 6.0–8.3)

## 2023-08-04 LAB — URINALYSIS
Bilirubin Urine: NEGATIVE
Hgb urine dipstick: NEGATIVE
Ketones, ur: NEGATIVE
Leukocytes,Ua: NEGATIVE
Nitrite: NEGATIVE
Specific Gravity, Urine: 1.01 (ref 1.000–1.030)
Total Protein, Urine: NEGATIVE
Urine Glucose: NEGATIVE
Urobilinogen, UA: 0.2 (ref 0.0–1.0)
pH: 6 (ref 5.0–8.0)

## 2023-08-04 LAB — LIPID PANEL
Cholesterol: 214 mg/dL — ABNORMAL HIGH (ref 0–200)
HDL: 42.2 mg/dL (ref >39.00)
LDL Cholesterol: 129 mg/dL — ABNORMAL HIGH (ref 0–99)
NonHDL: 172.2
Total CHOL/HDL Ratio: 5
Triglycerides: 215 mg/dL — ABNORMAL HIGH (ref 0.0–149.0)
VLDL: 43 mg/dL — ABNORMAL HIGH (ref 0.0–40.0)

## 2023-08-04 NOTE — Assessment & Plan Note (Signed)
 On Vit D

## 2023-08-04 NOTE — Progress Notes (Signed)
 Subjective:  Patient ID: Antonio Yu, male    DOB: [birth year]  Age: 73 y.o. MRN: [id number]  CC: Annual Exam   HPI Antonio Yu presents for a well exam  Outpatient Medications Prior to Visit  Medication Sig Dispense Refill   Cholecalciferol (VITAMIN D ) 50 MCG (2000 UT) CAPS 1 po qd 100 capsule 3   finasteride  (PROSCAR ) 5 MG tablet Take 1 tablet by mouth once daily 90 tablet 3   vitamin B-12 (CYANOCOBALAMIN) 1000 MCG tablet Take 1 tablet (1,000 mcg total) by mouth daily. 100 tablet 3   triamcinolone  ointment (KENALOG ) 0.1 % Apply 1 application topically 2 (two) times daily as needed. (Patient not taking: Reported on 08/04/2023) 80 g 2   doxycycline  (VIBRA -TABS) 100 MG tablet Take 1 tablet (100 mg total) by mouth 2 (two) times daily. 20 tablet 0   predniSONE  (DELTASONE ) 10 MG tablet 3 tabs by mouth per day for 3 days,2tabs per day for 3 days,1tab per day for 3 days 18 tablet 0   No facility-administered medications prior to visit.    ROS: Review of Systems  Constitutional:  Negative for appetite change, fatigue and unexpected weight change.  HENT:  Negative for congestion, nosebleeds, sneezing, sore throat and trouble swallowing.   Eyes:  Negative for itching and visual disturbance.  Respiratory:  Negative for cough.   Cardiovascular:  Negative for chest pain, palpitations and leg swelling.  Gastrointestinal:  Negative for abdominal distention, blood in stool, diarrhea and nausea.  Genitourinary:  Negative for frequency and hematuria.  Musculoskeletal:  Negative for back pain, gait problem, joint swelling and neck pain.  Skin:  Negative for rash.  Neurological:  Negative for dizziness, tremors, speech difficulty and weakness.  Psychiatric/Behavioral:  Negative for agitation, dysphoric mood, sleep disturbance and suicidal ideas. The patient is not nervous/anxious.     Objective:  BP 116/72 (BP Location: Left Arm, Patient Position: Sitting)   Pulse 75   Temp 97.8  F (36.6 C) (Temporal)   Ht 5' 4 (1.626 m)   Wt 147 lb (66.7 kg)   SpO2 97%   BMI 25.23 kg/m   BP Readings from Last 3 Encounters:  08/04/23 116/72  12/01/22 (!) 140/88  09/06/22 118/78    Wt Readings from Last 3 Encounters:  08/04/23 147 lb (66.7 kg)  12/01/22 136 lb 6.4 oz (61.9 kg)  09/06/22 138 lb (62.6 kg)    Physical Exam Constitutional:      General: He is not in acute distress.    Appearance: He is well-developed.     Comments: NAD   Eyes:     Conjunctiva/sclera: Conjunctivae normal.     Pupils: Pupils are equal, round, and reactive to light.   Neck:     Thyroid : No thyromegaly.     Vascular: No JVD.   Cardiovascular:     Rate and Rhythm: Normal rate and regular rhythm.     Heart sounds: Normal heart sounds. No murmur heard.    No friction rub. No gallop.  Pulmonary:     Effort: Pulmonary effort is normal. No respiratory distress.     Breath sounds: Normal breath sounds. No wheezing or rales.  Chest:     Chest wall: No tenderness.  Abdominal:     General: Bowel sounds are normal. There is no distension.     Palpations: Abdomen is soft. There is no mass.     Tenderness: There is no abdominal tenderness. There is no guarding or rebound.   Musculoskeletal:  General: No tenderness. Normal range of motion.     Cervical back: Normal range of motion.  Lymphadenopathy:     Cervical: No cervical adenopathy.   Skin:    General: Skin is warm and dry.     Findings: No rash.   Neurological:     Mental Status: He is alert and oriented to person, place, and time.     Cranial Nerves: No cranial nerve deficit.     Motor: No abnormal muscle tone.     Coordination: Coordination normal.     Gait: Gait normal.     Deep Tendon Reflexes: Reflexes are normal and symmetric.   Psychiatric:        Behavior: Behavior normal.        Thought Content: Thought content normal.        Judgment: Judgment normal.    Rectal- per Urology   Lab Results  Component  Value Date   WBC 4.5 09/06/2022   HGB 15.9 09/06/2022   HCT 48.4 09/06/2022   PLT 152.0 09/06/2022   GLUCOSE 105 (H) 09/06/2022   CHOL 210 (H) 09/06/2022   TRIG 150.0 (H) 09/06/2022   HDL 51.70 09/06/2022   LDLDIRECT 153.0 12/25/2019   LDLCALC 128 (H) 09/06/2022   ALT 18 09/06/2022   ALT 18 09/06/2022   AST 19 09/06/2022   AST 19 09/06/2022   NA 139 09/06/2022   K 4.7 09/06/2022   CL 104 09/06/2022   CREATININE 1.05 09/06/2022   BUN 14 09/06/2022   CO2 29 09/06/2022   TSH 2.21 09/06/2022   PSA 9.98 (H) 09/01/2021    MR PROSTATE W WO CONTRAST Result Date: 01/31/2018 CLINICAL DATA:  Prostate cancer, Gleason 3+3=6 adenocarcinoma the left lateral base. Continued elevation in PSA level. EXAM: MR PROSTATE WITHOUT AND WITH CONTRAST TECHNIQUE: Multiplanar multisequence MRI images were obtained of the pelvis centered about the prostate. Pre and post contrast images were obtained. CONTRAST:  14mL MULTIHANCE  GADOBENATE DIMEGLUMINE  529 MG/ML IV SOLN Creatinine was obtained on site at [facility] at [address] Results: Creatinine 1.2 mg/dL. COMPARISON:  Prostate MRI from 11/08/2016 FINDINGS: Prostate: Overall prominent transition zone/central gland with various nodules most of which are T2 hyperintense. The several T2 hypointense nodules are relatively well-defined and not associated with significantly activity on high B value diffusion-weighted images. A small PI-RADS category 3 lesion in the right posterolateral peripheral zone apex has a volume of 0.1 cubic cm and measures 0.7 by 0.2 by 0.5 cm. No appreciable abnormal early enhancement or abnormal activity on diffusion-weighted images. There is some linear bandlike PI-RADS category 2 appearance of the left prostate apex for example on images 15 through 17 of series 7, without other worrisome characteristics. Volume: 3D volumetric analysis: 75.14 cubic cm (6.0 by 5.1 by 5.3 cm). Transcapsular spread:  Absent Seminal vesicle  involvement: Absent Neurovascular bundle involvement: Absent Pelvic adenopathy: Absent Bone metastasis: Absent Other findings: No supplemental non-categorized findings. IMPRESSION: 1. Small PI-RADS category 3 lesion in the right posterolateral apex peripheral zone. There is some minimal PI-RADS category 2 bandlike signal in the left prostate apex. Appropriate targeting data for the PI-RADS category 3 lesion was sent to [facility]. 2. Prostate volume: 75.14 cubic cm. 3. Benign prostatic hypertrophy. Electronically Signed   By: Freida Jes M.D.   On: 01/31/2018 14:46    Assessment & Plan:   Problem List Items Addressed This Visit     Well adult exam - Primary    We discussed age appropriate  health related issues, including available/recomended screening tests and vaccinations. Labs were ordered to be later reviewed . All questions were answered. We discussed one or more of the following - seat belt use, use of sunscreen/sun exposure exercise, fall risk reduction, second hand smoke exposure, firearm use and storage, seat belt use, a need for adhering to healthy diet and exercise. Labs were discussed. All questions were answered. A  cardiac CT scan for calcium scoring test offered - declined 2021-22 Rectal - per Urology  - F/u Dr Rozanne Corners q 6-12 mo Colon 2015 due in 2025. Cologuard ordered Hep C(-)       Relevant Orders   TSH   Urinalysis   CBC with Differential/Platelet   Lipid panel   PSA   Comprehensive metabolic panel with GFR   Vitamin D  deficiency   On Vit D      Elevated PSA   F/u Dr Rozanne Corners q 6-12 mo      Relevant Orders   PSA   Other Visit Diagnoses       Screening for colon cancer       Relevant Orders   Cologuard         No orders of the defined types were placed in this encounter.     Follow-up: Return in about 1 year (around 08/03/2024) for Wellness Exam.  Anitra Barn, MD

## 2023-08-04 NOTE — Assessment & Plan Note (Signed)
 F/u Dr Rozanne Corners q 6-12 mo

## 2023-08-04 NOTE — Assessment & Plan Note (Addendum)
  We discussed age appropriate health related issues, including available/recomended screening tests and vaccinations. Labs were ordered to be later reviewed . All questions were answered. We discussed one or more of the following - seat belt use, use of sunscreen/sun exposure exercise, fall risk reduction, second hand smoke exposure, firearm use and storage, seat belt use, a need for adhering to healthy diet and exercise. Labs were discussed. All questions were answered. A  cardiac CT scan for calcium scoring test offered - declined 2021-22 Rectal - per Urology  - F/u Dr Rozanne Corners q 6-12 mo Colon 2015 due in 2025. Cologuard ordered Hep C(-)

## 2023-08-05 LAB — TSH: TSH: 3.33 u[IU]/mL (ref 0.35–5.50)

## 2023-08-05 LAB — PSA: PSA: 11.05 ng/mL — ABNORMAL HIGH (ref 0.10–4.00)

## 2023-08-09 ENCOUNTER — Other Ambulatory Visit: Payer: Self-pay | Admitting: Urology

## 2023-08-09 DIAGNOSIS — C61 Malignant neoplasm of prostate: Secondary | ICD-10-CM

## 2023-08-12 ENCOUNTER — Encounter: Payer: Self-pay | Admitting: Internal Medicine

## 2023-08-14 ENCOUNTER — Ambulatory Visit: Payer: Self-pay | Admitting: Internal Medicine

## 2023-08-15 DIAGNOSIS — Z1211 Encounter for screening for malignant neoplasm of colon: Secondary | ICD-10-CM | POA: Diagnosis not present

## 2023-08-19 LAB — COLOGUARD: COLOGUARD: NEGATIVE

## 2023-09-16 ENCOUNTER — Other Ambulatory Visit: Payer: Self-pay | Admitting: Internal Medicine

## 2023-10-31 ENCOUNTER — Ambulatory Visit
Admission: RE | Admit: 2023-10-31 | Discharge: 2023-10-31 | Disposition: A | Discharge status: Home / Self Care | Source: Ambulatory Visit | Attending: Urology | Admitting: Urology

## 2023-10-31 DIAGNOSIS — C61 Malignant neoplasm of prostate: Secondary | ICD-10-CM | POA: Diagnosis not present

## 2023-10-31 MED ORDER — GADOPICLENOL 0.5 MMOL/ML IV SOLN
7.5000 mL | Freq: Once | INTRAVENOUS | Status: AC | PRN
Start: 2023-10-31 — End: 2023-10-31
  Administered 2023-10-31: 7.5 mL via INTRAVENOUS

## 2023-11-16 DIAGNOSIS — C61 Malignant neoplasm of prostate: Secondary | ICD-10-CM | POA: Diagnosis not present

## 2023-11-23 DIAGNOSIS — C61 Malignant neoplasm of prostate: Secondary | ICD-10-CM | POA: Diagnosis not present

## 2023-11-23 DIAGNOSIS — N401 Enlarged prostate with lower urinary tract symptoms: Secondary | ICD-10-CM | POA: Diagnosis not present

## 2023-12-14 ENCOUNTER — Other Ambulatory Visit: Payer: Self-pay | Admitting: Internal Medicine

## 2023-12-23 ENCOUNTER — Ambulatory Visit

## 2024-01-26 DIAGNOSIS — H52223 Regular astigmatism, bilateral: Secondary | ICD-10-CM | POA: Diagnosis not present

## 2024-01-26 DIAGNOSIS — H2513 Age-related nuclear cataract, bilateral: Secondary | ICD-10-CM | POA: Diagnosis not present

## 2024-01-26 DIAGNOSIS — H5203 Hypermetropia, bilateral: Secondary | ICD-10-CM | POA: Diagnosis not present

## 2024-01-26 DIAGNOSIS — H524 Presbyopia: Secondary | ICD-10-CM | POA: Diagnosis not present

## 2024-01-26 DIAGNOSIS — H40053 Ocular hypertension, bilateral: Secondary | ICD-10-CM | POA: Diagnosis not present

## 2024-01-26 DIAGNOSIS — H35372 Puckering of macula, left eye: Secondary | ICD-10-CM | POA: Diagnosis not present

## 2024-03-09 ENCOUNTER — Other Ambulatory Visit: Payer: Self-pay | Admitting: Internal Medicine

## 5885-11-22 DEATH — deceased
